# Patient Record
Sex: Female | Born: 1990 | Hispanic: Yes | Marital: Married | Smoking: Never smoker
Health system: Southern US, Community
[De-identification: ages and names within clinical notes are randomized; demographics above are authoritative.]

## PROBLEM LIST (undated history)

## (undated) ENCOUNTER — Inpatient Hospital Stay (HOSPITAL_COMMUNITY): Payer: Self-pay

## (undated) DIAGNOSIS — Z789 Other specified health status: Secondary | ICD-10-CM

## (undated) HISTORY — DX: Other specified health status: Z78.9

## (undated) HISTORY — PX: RHINOPLASTY: SUR1284

---

## 2014-03-19 ENCOUNTER — Emergency Department: Payer: Self-pay | Admitting: Emergency Medicine

## 2014-05-14 LAB — OB RESULTS CONSOLE HEPATITIS B SURFACE ANTIGEN: Hepatitis B Surface Ag: NEGATIVE

## 2014-05-14 LAB — OB RESULTS CONSOLE ABO/RH: RH Type: POSITIVE

## 2014-05-14 LAB — OB RESULTS CONSOLE ANTIBODY SCREEN: Antibody Screen: NEGATIVE

## 2014-05-14 LAB — OB RESULTS CONSOLE RUBELLA ANTIBODY, IGM: RUBELLA: IMMUNE

## 2014-05-14 LAB — OB RESULTS CONSOLE GC/CHLAMYDIA
Chlamydia: NEGATIVE
Gonorrhea: NEGATIVE

## 2014-05-14 LAB — OB RESULTS CONSOLE HIV ANTIBODY (ROUTINE TESTING): HIV: NONREACTIVE

## 2014-05-14 LAB — OB RESULTS CONSOLE RPR: RPR: NONREACTIVE

## 2014-08-03 NOTE — L&D Delivery Note (Signed)
Final Labor Progress Note At 3007 pt reports an increased in rectal pressure.  VE C/C/+2.  FHR remained reassuring with reoccuring variables decelerations, over all reassured. Dr Alesia Richards called to the bedside to evaluate decels  Vaginal Delivery Note The pt utilized an epidural as pain management.   Spontaneous rupture of membranes today, at 0213, clear.  GBS was negative.  Cervical dilation was complete at 1420.  NICHD Category 2.    Pushing with guidance began at  1436.   After 13 minutes of pushing the head, shoulders and the body of a viable female infant "Antonio" delivered spontaneously with maternal effort in the ROA position at 1449  With vigorous tone and spontaneous cry, the infant was placed on moms abd.  After the umbilical cord was clamped it was cut by the FOB, then cord blood was obtained for evaluation. Spontaneous delivery of a intact placenta with a 3 vessel cord via Shultz at  1512.   Episiotomy: None   The vulva, perineum, vaginal vault, rectum and cervix were inspected and revealed a 1 degree vaginal which was repair using a 3-0 vicryl on a CT needle and a superficial bilateral inner labial - the right was repaired using a 4-0 vicryl on a SH and the left was hemostatic, not repaired )  Lidocaine was not used, the epidural was sufficient for the repair.   Postpartum pitocin as ordered.  Fundus firm, lochia minimum, bleeding under control. EBL TBD, Pt hemodynamically stable.   Sponge, laps and needle count correct and verified with the primary care nurse.  Attending MD available at all times.    Routine postpartum orders   Mother unsure about method of contraception Infant to have patient out circumcision   Placenta to pathology: NO     Cord Gases sent to lab: YES Cord blood sent to lab: YES   APGARS:  8 at 1 minute and 9 at 5 minutes Weight:. TBD     Both mom and baby were left in stable condition      Zachary Nole, CNM, MSN 12/20/2014. 4:01 PM

## 2014-10-10 ENCOUNTER — Inpatient Hospital Stay (HOSPITAL_COMMUNITY)
Admission: AD | Admit: 2014-10-10 | Discharge: 2014-10-10 | Disposition: A | Discharge status: Home / Self Care | Payer: Medicaid Other | Source: Ambulatory Visit | Attending: Obstetrics & Gynecology | Admitting: Obstetrics & Gynecology

## 2014-10-10 ENCOUNTER — Other Ambulatory Visit: Payer: Self-pay | Admitting: Obstetrics and Gynecology

## 2014-10-10 ENCOUNTER — Encounter (HOSPITAL_COMMUNITY): Payer: Self-pay | Admitting: *Deleted

## 2014-10-10 DIAGNOSIS — Z3A30 30 weeks gestation of pregnancy: Secondary | ICD-10-CM | POA: Diagnosis not present

## 2014-10-10 MED ORDER — BETAMETHASONE SOD PHOS & ACET 6 (3-3) MG/ML IJ SUSP
12.0000 mg | Freq: Every day | INTRAMUSCULAR | Status: DC
  Administered 2014-10-10: 12 mg via INTRAMUSCULAR
  Filled 2014-10-10: qty 2

## 2014-10-11 ENCOUNTER — Inpatient Hospital Stay (HOSPITAL_COMMUNITY)
Admission: AD | Admit: 2014-10-11 | Discharge: 2014-10-11 | Disposition: A | Discharge status: Home / Self Care | Payer: Medicaid Other | Source: Ambulatory Visit | Attending: Obstetrics and Gynecology | Admitting: Obstetrics and Gynecology

## 2014-10-11 DIAGNOSIS — Z3A30 30 weeks gestation of pregnancy: Secondary | ICD-10-CM | POA: Insufficient documentation

## 2014-10-11 MED ORDER — BETAMETHASONE SOD PHOS & ACET 6 (3-3) MG/ML IJ SUSP
12.0000 mg | Freq: Once | INTRAMUSCULAR | Status: AC
  Administered 2014-10-11: 12 mg via INTRAMUSCULAR
  Filled 2014-10-11: qty 2

## 2014-10-11 NOTE — MAU Note (Signed)
Pt here for 2nd betamethasone injection.

## 2014-11-29 LAB — OB RESULTS CONSOLE GBS: GBS: NEGATIVE

## 2014-12-03 ENCOUNTER — Institutional Professional Consult (permissible substitution): Payer: Self-pay | Admitting: Pediatrics

## 2014-12-20 ENCOUNTER — Inpatient Hospital Stay (HOSPITAL_COMMUNITY)
Admission: AD | Admit: 2014-12-20 | Discharge: 2014-12-22 | DRG: 775 | Disposition: A | Discharge status: Home / Self Care | Payer: Medicaid Other | Source: Ambulatory Visit | Attending: Obstetrics & Gynecology | Admitting: Obstetrics & Gynecology

## 2014-12-20 ENCOUNTER — Inpatient Hospital Stay (HOSPITAL_COMMUNITY): Payer: Medicaid Other | Admitting: Anesthesiology

## 2014-12-20 ENCOUNTER — Encounter (HOSPITAL_COMMUNITY): Payer: Self-pay | Admitting: *Deleted

## 2014-12-20 DIAGNOSIS — Z3403 Encounter for supervision of normal first pregnancy, third trimester: Secondary | ICD-10-CM | POA: Diagnosis present

## 2014-12-20 DIAGNOSIS — Z3A39 39 weeks gestation of pregnancy: Secondary | ICD-10-CM | POA: Diagnosis present

## 2014-12-20 DIAGNOSIS — IMO0001 Reserved for inherently not codable concepts without codable children: Secondary | ICD-10-CM

## 2014-12-20 LAB — CBC
HEMATOCRIT: 35.3 % — AB (ref 36.0–46.0)
Hemoglobin: 11.7 g/dL — ABNORMAL LOW (ref 12.0–15.0)
MCH: 25.9 pg — AB (ref 26.0–34.0)
MCHC: 33.1 g/dL (ref 30.0–36.0)
MCV: 78.1 fL (ref 78.0–100.0)
Platelets: 248 10*3/uL (ref 150–400)
RBC: 4.52 MIL/uL (ref 3.87–5.11)
RDW: 14.8 % (ref 11.5–15.5)
WBC: 16.4 10*3/uL — ABNORMAL HIGH (ref 4.0–10.5)

## 2014-12-20 LAB — TYPE AND SCREEN
ABO/RH(D): A POS
ANTIBODY SCREEN: NEGATIVE

## 2014-12-20 LAB — HIV ANTIBODY (ROUTINE TESTING W REFLEX): HIV Screen 4th Generation wRfx: NONREACTIVE

## 2014-12-20 LAB — RPR: RPR: NONREACTIVE

## 2014-12-20 LAB — ABO/RH: ABO/RH(D): A POS

## 2014-12-20 MED ORDER — SIMETHICONE 80 MG PO CHEW: 80.0000 mg | CHEWABLE_TABLET | ORAL | Status: DC | PRN

## 2014-12-20 MED ORDER — OXYCODONE-ACETAMINOPHEN 5-325 MG PO TABS: 2.0000 | ORAL_TABLET | ORAL | Status: DC | PRN

## 2014-12-20 MED ORDER — ZOLPIDEM TARTRATE 5 MG PO TABS: 5.0000 mg | ORAL_TABLET | Freq: Every evening | ORAL | Status: DC | PRN

## 2014-12-20 MED ORDER — LACTATED RINGERS IV SOLN: INTRAVENOUS | Status: DC

## 2014-12-20 MED ORDER — DIBUCAINE 1 % RE OINT: 1.0000 "application " | TOPICAL_OINTMENT | RECTAL | Status: DC | PRN

## 2014-12-20 MED ORDER — EPHEDRINE 5 MG/ML INJ
10.0000 mg | INTRAVENOUS | Status: DC | PRN
  Filled 2014-12-20: qty 2

## 2014-12-20 MED ORDER — FENTANYL CITRATE (PF) 100 MCG/2ML IJ SOLN
50.0000 ug | INTRAMUSCULAR | Status: DC | PRN
  Administered 2014-12-20: 50 ug via INTRAVENOUS
  Filled 2014-12-20: qty 2

## 2014-12-20 MED ORDER — ONDANSETRON HCL 4 MG/2ML IJ SOLN: 4.0000 mg | INTRAMUSCULAR | Status: DC | PRN

## 2014-12-20 MED ORDER — ACETAMINOPHEN 325 MG PO TABS: 650.0000 mg | ORAL_TABLET | ORAL | Status: DC | PRN

## 2014-12-20 MED ORDER — OXYTOCIN 40 UNITS IN LACTATED RINGERS INFUSION - SIMPLE MED
62.5000 mL/h | INTRAVENOUS | Status: DC
  Administered 2014-12-20: 62.5 mL/h via INTRAVENOUS
  Filled 2014-12-20: qty 1000

## 2014-12-20 MED ORDER — IBUPROFEN 600 MG PO TABS
600.0000 mg | ORAL_TABLET | Freq: Four times a day (QID) | ORAL | Status: DC
  Administered 2014-12-20 – 2014-12-22 (×6): 600 mg via ORAL
  Filled 2014-12-20 (×6): qty 1

## 2014-12-20 MED ORDER — OXYTOCIN 40 UNITS IN LACTATED RINGERS INFUSION - SIMPLE MED
1.0000 m[IU]/min | INTRAVENOUS | Status: DC
  Administered 2014-12-20: 2 m[IU]/min via INTRAVENOUS

## 2014-12-20 MED ORDER — ONDANSETRON HCL 4 MG/2ML IJ SOLN: 4.0000 mg | Freq: Four times a day (QID) | INTRAMUSCULAR | Status: DC | PRN

## 2014-12-20 MED ORDER — WITCH HAZEL-GLYCERIN EX PADS: 1.0000 "application " | MEDICATED_PAD | CUTANEOUS | Status: DC | PRN

## 2014-12-20 MED ORDER — TETANUS-DIPHTH-ACELL PERTUSSIS 5-2.5-18.5 LF-MCG/0.5 IM SUSP: 0.5000 mL | Freq: Once | INTRAMUSCULAR | Status: DC

## 2014-12-20 MED ORDER — DIPHENHYDRAMINE HCL 50 MG/ML IJ SOLN: 12.5000 mg | INTRAMUSCULAR | Status: DC | PRN

## 2014-12-20 MED ORDER — FENTANYL 2.5 MCG/ML BUPIVACAINE 1/10 % EPIDURAL INFUSION (WH - ANES)
14.0000 mL/h | INTRAMUSCULAR | Status: DC | PRN
  Administered 2014-12-20 (×2): 14 mL/h via EPIDURAL
  Filled 2014-12-20 (×2): qty 125

## 2014-12-20 MED ORDER — CITRIC ACID-SODIUM CITRATE 334-500 MG/5ML PO SOLN: 30.0000 mL | ORAL | Status: DC | PRN

## 2014-12-20 MED ORDER — LACTATED RINGERS IV SOLN
500.0000 mL | INTRAVENOUS | Status: DC | PRN
  Administered 2014-12-20 (×2): 500 mL via INTRAVENOUS

## 2014-12-20 MED ORDER — OXYCODONE-ACETAMINOPHEN 5-325 MG PO TABS: 1.0000 | ORAL_TABLET | ORAL | Status: DC | PRN

## 2014-12-20 MED ORDER — BENZOCAINE-MENTHOL 20-0.5 % EX AERO
1.0000 "application " | INHALATION_SPRAY | CUTANEOUS | Status: DC | PRN
  Filled 2014-12-20: qty 56

## 2014-12-20 MED ORDER — OXYCODONE-ACETAMINOPHEN 5-325 MG PO TABS
1.0000 | ORAL_TABLET | ORAL | Status: DC | PRN
Start: 2014-12-20 — End: 2014-12-20

## 2014-12-20 MED ORDER — PHENYLEPHRINE 40 MCG/ML (10ML) SYRINGE FOR IV PUSH (FOR BLOOD PRESSURE SUPPORT)
80.0000 ug | PREFILLED_SYRINGE | INTRAVENOUS | Status: DC | PRN
  Filled 2014-12-20: qty 20
  Filled 2014-12-20: qty 2

## 2014-12-20 MED ORDER — LIDOCAINE HCL (PF) 1 % IJ SOLN
INTRAMUSCULAR | Status: DC | PRN
  Administered 2014-12-20 (×2): 5 mL
  Administered 2014-12-20: 3 mL

## 2014-12-20 MED ORDER — SENNOSIDES-DOCUSATE SODIUM 8.6-50 MG PO TABS
2.0000 | ORAL_TABLET | ORAL | Status: DC
  Administered 2014-12-21 – 2014-12-22 (×2): 2 via ORAL
  Filled 2014-12-20 (×2): qty 2

## 2014-12-20 MED ORDER — PRENATAL MULTIVITAMIN CH
1.0000 | ORAL_TABLET | Freq: Every day | ORAL | Status: DC
  Administered 2014-12-21: 1 via ORAL
  Filled 2014-12-20: qty 1

## 2014-12-20 MED ORDER — OXYTOCIN BOLUS FROM INFUSION: 500.0000 mL | INTRAVENOUS | Status: DC

## 2014-12-20 MED ORDER — TERBUTALINE SULFATE 1 MG/ML IJ SOLN
0.2500 mg | Freq: Once | INTRAMUSCULAR | Status: DC | PRN
Start: 2014-12-20 — End: 2014-12-20
  Filled 2014-12-20: qty 1

## 2014-12-20 MED ORDER — LIDOCAINE HCL (PF) 1 % IJ SOLN
30.0000 mL | INTRAMUSCULAR | Status: DC | PRN
  Filled 2014-12-20: qty 30

## 2014-12-20 MED ORDER — ONDANSETRON HCL 4 MG PO TABS: 4.0000 mg | ORAL_TABLET | ORAL | Status: DC | PRN

## 2014-12-20 MED ORDER — DIPHENHYDRAMINE HCL 25 MG PO CAPS: 25.0000 mg | ORAL_CAPSULE | Freq: Four times a day (QID) | ORAL | Status: DC | PRN

## 2014-12-20 MED ORDER — LANOLIN HYDROUS EX OINT: TOPICAL_OINTMENT | CUTANEOUS | Status: DC | PRN

## 2014-12-20 NOTE — Progress Notes (Signed)
Delivery of live viable female by [employer], CNM APGARS 8,9

## 2014-12-20 NOTE — Plan of Care (Signed)
Problem: Consults Goal: Birthing Suites Patient Information Press F2 to bring up selections list  Outcome: Completed/Met Date Met:  12/20/14  Pt 37-[redacted] weeks EGA and Other (specify with a note) SROM

## 2014-12-20 NOTE — Anesthesia Preprocedure Evaluation (Signed)
Anesthesia Evaluation  Patient identified by MRN, date of birth, ID band Patient awake    Reviewed: Allergy & Precautions, H&P , NPO status , Patient's Chart, lab work & pertinent test results  History of Anesthesia Complications Negative for: history of anesthetic complications  Airway Mallampati: II  TM Distance: >3 FB Neck ROM: full    Dental no notable dental hx. (+) Teeth Intact   Pulmonary neg pulmonary ROS,  breath sounds clear to auscultation  Pulmonary exam normal       Cardiovascular negative cardio ROS Normal cardiovascular examRhythm:regular Rate:Normal     Neuro/Psych negative neurological ROS  negative psych ROS   GI/Hepatic negative GI ROS, Neg liver ROS,   Endo/Other  negative endocrine ROS  Renal/GU negative Renal ROS  negative genitourinary   Musculoskeletal   Abdominal   Peds  Hematology negative hematology ROS (+)   Anesthesia Other Findings   Reproductive/Obstetrics (+) Pregnancy                             Anesthesia Physical Anesthesia Plan  ASA: II  Anesthesia Plan: Epidural   Post-op Pain Management:    Induction:   Airway Management Planned:   Additional Equipment:   Intra-op Plan:   Post-operative Plan:   Informed Consent: I have reviewed the patients History and Physical, chart, labs and discussed the procedure including the risks, benefits and alternatives for the proposed anesthesia with the patient or authorized representative who has indicated his/her understanding and acceptance.     Plan Discussed with:   Anesthesia Plan Comments:         Anesthesia Quick Evaluation

## 2014-12-20 NOTE — MAU Note (Signed)
Pt reports ROM at 0330, contractions

## 2014-12-20 NOTE — Progress Notes (Signed)
V. Standard requested to come evalute FHR tracing, with consistent variables, minimal variability and increased baseline, and interventions provided. Provider on way to assess

## 2014-12-20 NOTE — Progress Notes (Addendum)
Labor Progress  Subjective: Comfortable with epidural  Objective: BP 123/56 mmHg  Pulse 101  Temp(Src) 98.8 F (37.1 C) (Oral)  Resp 18  Ht 5\' 2"  (1.575 m)  Wt 162 lb (73.483 kg)  BMI 29.62 kg/m2  SpO2 99%   Total I/O In: -  Out: 1000 [Urine:1000] FHT:130 moderate variability, + accel, occasional variable decel,  CTX:  regular, every 6-8 minutes Uterus gravid, soft non tender SVE:  Dilation: 8 Effacement (%): 80 Station: +1 Exam by::  (V. Kaison Mcparland, CNM)   Assessment:  IUP at 39.6 weeks NICHD: Category 2 Membranes:  intact Labor progress: adquate labor KDT:[id number]   Plan: Continue labor plan Continuous monitoring Rest Frequent position changes to facilitate fetal rotation and descent. Will reassess with cervical exam at 1300 or earlier if necessary Start pitocin per protocol      Elfida Shimada, CNM, MSN 12/20/2014. 11:26 AM    Addendum 1110 VE unchange FHT 145, + accel, moderate variability, occasional variable decel Start piticon per protocol

## 2014-12-20 NOTE — H&P (Signed)
  Tiffany Chavez is a 24 y.o. female, G1P0 at 39.6 weeks, presenting for contractions.  Patient reports contractions have been irregular for 2 days, but become regular this am around 1230.  Patient further reports SROM while en route to the hospital.    There are no active problems to display for this patient.   History of present pregnancy: Patient entered care at 9.4 weeks.   EDC of 12/21/2014 was established by 10.5wk [place] on 05/30/2014.   Anatomy scan:  18.5 weeks, with limited findings and right pyelectasis noted.  Anterior placenta.   Additional [place] evaluations:   -20.6wks Follow up anatomy. EFW 15 oz = 69%tile. Transverse pres. Head maternal R. Nelda Marseille. Unilateral pylectasis LK=.51/RK= WNL. Previous u/s RK=.51/ LK WNL.   -28.6wks [place]: SIUP, VERTEX, NORMAL FLUID, NORMAL ANATOMY TODAY, CX 3.99 CM, GROWTH 69% Significant prenatal events:  Patient with common pregnancy discomforts to include n/v, headaches, pelvic pressure, cramping, vaginal discharge, and carpal tunnel.  Patient treated for yeast, BV, and cervicitis.   Last evaluation:  12/19/2014   39 wks 5 days  By L.Eulas Post, FNP    Vertex, FHR 130, SVE: 1cm / 50% / -2, B/P 112/70, Wt 161 lbs  OB History    Gravida Para Term Preterm AB TAB SAB Ectopic Multiple Living   1              History reviewed. No pertinent past medical history. History reviewed. No pertinent past surgical history. Family History: family history is not on file. Social History:  reports that she has never smoked. She has never used smokeless tobacco. Her alcohol and drug histories are not on file.   Prenatal Transfer Tool  Maternal Diabetes: No Genetic Screening: Declined Maternal Ultrasounds/Referrals: Normal Fetal Ultrasounds or other Referrals:  None Maternal Substance Abuse:  No Significant Maternal Medications:  None Significant Maternal Lab Results: Lab values include: Group B Strep negative    ROS:  +Ctx, +LOF, +FM, -VB  No Known  Allergies   Dilation: 1 Effacement (%): 100 Station: -1 Exam by:: Shoshanna Mcquitty,CNM Blood pressure 133/76, pulse 93, temperature 98.7 F (37.1 C), temperature source Oral, resp. rate 20, height 5\' 2"  (1.575 m), weight 73.483 kg (162 lb).  Physical Exam  Constitutional: She is oriented to person, place, and time. She appears well-developed and well-nourished.  HENT:  Head: Normocephalic and atraumatic.  Eyes: EOM are normal.  Neck: Normal range of motion.  Cardiovascular: Normal rate, regular rhythm and normal heart sounds.   Respiratory: Effort normal.  GI: Soft.  Musculoskeletal: Normal range of motion.  Neurological: She is alert and oriented to person, place, and time.  Skin: Skin is warm and dry.     FHR: 120 bpm, Mod Var, -Decels, +Accels UCs:  Q2-23min, palpates moderate  Prenatal labs: ABO, Rh: A/Positive/-- (10/12 0000) Antibody: Negative (10/12 0000) Rubella:   Immune RPR: Nonreactive (10/12 0000)  HBsAg: Negative (10/12 0000)  HIV: Non-reactive (10/12 0000)  GBS: Negative (04/28 0000) Sickle cell/Hgb electrophoresis:  N/A Pap:  Normal 06/2014 GC:  Negative Chlamydia:  Negative Other:  +fFN 10/10/2014    Assessment IUP at 39.6wks Cat I FT GBS Negative SROM Early Labor  Plan: Admit to BS Routine Labor and Delivery Orders per [facility] Protocol Okay for epidural Will update Dr. Chauncey Cruel Rivard on patient status    Gavin Pound [facility], MSN 12/20/2014, 4:13 AM

## 2014-12-20 NOTE — Anesthesia Postprocedure Evaluation (Signed)
Anesthesia Post Note  Patient: Tiffany Chavez  Procedure(s) Performed: * No procedures listed *  Anesthesia type: Epidural  Patient location: Mother/Baby  Post pain: Pain level controlled  Post assessment: Post-op Vital signs reviewed  Last Vitals:  Filed Vitals:   12/20/14 1701  BP: 114/65  Pulse: 92  Temp: 36.9 C  Resp: 20    Post vital signs: Reviewed  Level of consciousness:alert  Complications: No apparent anesthesia complications

## 2014-12-20 NOTE — Anesthesia Procedure Notes (Signed)
Epidural Patient location during procedure: OB  Staffing Anesthesiologist: Montez Hageman Performed by: anesthesiologist   Preanesthetic Checklist Completed: patient identified, site marked, surgical consent, pre-op evaluation, timeout performed, IV checked, risks and benefits discussed and monitors and equipment checked  Epidural Patient position: sitting Prep: DuraPrep Patient monitoring: heart rate, continuous pulse ox and blood pressure Approach: midline Location: L2-L3 Injection technique: LOR saline  Needle:  Needle type: Tuohy  Needle gauge: 17 G Needle length: 9 cm and 9 Needle insertion depth: 5 cm Catheter type: closed end flexible Catheter size: 20 Guage Catheter at skin depth: 10 cm Test dose: negative  Assessment Events: blood not aspirated, injection not painful, no injection resistance, negative IV test and no paresthesia  Additional Notes   Patient tolerated the insertion well without complications.

## 2014-12-20 NOTE — Progress Notes (Signed)
Merlyn Albert, CNM at bedside discussing poc for patient. Notified of FHR tracing with consistent variables, increased baseline, and minimal variability. Interventions provided to patient to include O2, position changes, IV bolus, and IFSE placement.

## 2014-12-21 LAB — CBC
HCT: 27.2 % — ABNORMAL LOW (ref 36.0–46.0)
Hemoglobin: 8.7 g/dL — ABNORMAL LOW (ref 12.0–15.0)
MCH: 25.4 pg — ABNORMAL LOW (ref 26.0–34.0)
MCHC: 32 g/dL (ref 30.0–36.0)
MCV: 79.5 fL (ref 78.0–100.0)
Platelets: 185 10*3/uL (ref 150–400)
RBC: 3.42 MIL/uL — AB (ref 3.87–5.11)
RDW: 15.1 % (ref 11.5–15.5)
WBC: 11.8 10*3/uL — ABNORMAL HIGH (ref 4.0–10.5)

## 2014-12-21 MED ORDER — TETANUS-DIPHTH-ACELL PERTUSSIS 5-2.5-18.5 LF-MCG/0.5 IM SUSP
0.5000 mL | Freq: Once | INTRAMUSCULAR | Status: AC
  Administered 2014-12-22: 0.5 mL via INTRAMUSCULAR

## 2014-12-21 NOTE — Lactation Note (Signed)
This note was copied from the chart of [place]. Lactation Consultation Note Initial visit at 28 hours of age.  Mom reports several good feedings with minimal pain.  Baby just finished a 22 minute feeding.  Noted mom is latching baby in cross cradle hold.  Discussed benefits of other positions.  [facility] LC resources given and discussed.  Encouraged to feed with early cues on demand.  Early newborn behavior discussed.  Hand expression reported by mom with colostrum visible.  Mom to call for assist as needed.    Patient Name: Tiffany Chavez Today's Date: 12/21/2014     Maternal Data Has patient been taught Hand Expression?: Yes Does the patient have breastfeeding experience prior to this delivery?: No  Feeding Feeding Type: Breast Fed Length of feed: 5 min  LATCH Score/Interventions Latch: Repeated attempts needed to sustain latch, nipple held in mouth throughout feeding, stimulation needed to elicit sucking reflex. Intervention(s): Adjust position;Assist with latch;Breast massage;Breast compression  Audible Swallowing: None Intervention(s): Hand expression;Skin to skin Intervention(s): Skin to skin;Hand expression  Type of Nipple: Everted at rest and after stimulation  Comfort (Breast/Nipple): Soft / non-tender     Hold (Positioning): Assistance needed to correctly position infant at breast and maintain latch. Intervention(s): Breastfeeding basics reviewed  LATCH Score: 6  Lactation Tools Discussed/Used     Consult Status Consult Status: Follow-up Date: 12/22/14 Follow-up type: In-patient    Justice Britain 12/21/2014, 5:09 PM

## 2014-12-21 NOTE — Discharge Summary (Signed)
Vaginal Delivery Discharge Summary  ALL information will be verified prior to discharge  Tiffany Chavez  DOB:    Aug 11, 1990 MRN:    [id number] CSN:    [id number]  Date of admission:                  12/20/14  Date of discharge:                   12/22/14  Procedures this admission: SVD  Date of Delivery: 12/20/14  Newborn Data:  Live born  Information for the patient's newborn:  Para Skeans Boy Gulianna [[id number]]  female   Live born female  Birth Weight: 7 lb 3 oz (3260 g) APGAR: 8, 9  Home with mother. Name: Antonio Circumcision Plan: out patient   History of Present Illness: Tiffany Chavez is a 24 y.o. female, G1P1001, who presents at [redacted]w[redacted]d weeks gestation. The patient has been followed at the [facility] division of [facility]. She was admitted onset of labor. Her pregnancy has been complicated by:  Patient Active Problem List   Diagnosis Date Noted  . Active labor 12/20/2014  . Normal vaginal delivery 12/20/2014    Hospital course: The patient was admitted for labor.   Her labor was not complicated. She proceeded to have a vaginal delivery of a healthy infant. Her delivery was not complicated. Her postpartum course was not complicated. She was discharged to home on postpartum day 2 doing well.  Feeding: breast  Contraception: unsure     Discharge hemoglobin: HEMOGLOBIN  Date Value Ref Range Status  12/21/2014 8.7* 12.0 - 15.0 g/dL Final    Comment:    REPEATED TO VERIFY DELTA CHECK NOTED    HCT  Date Value Ref Range Status  12/21/2014 27.2* 36.0 - 46.0 % Final    PreNatal Labs ABO, Rh: --/--/A POS, A POS (05/19 0420)  Rhogam given on: sure Antibody: NEG (05/19 0420) Rubella:    immune RPR: Non Reactive (05/19 0420)  HBsAg: Negative (10/12 0000)  HIV: Non-reactive (10/12 0000)  GBS: Negative (04/28 0000)  Discharge Physical Exam:  General: alert and  cooperative Lochia: appropriate Uterine Fundus: firm Incision: healing well DVT Evaluation: No evidence of DVT seen on physical exam.  Intrapartum Procedures: spontaneous vaginal delivery Postpartum Procedures: none Complications-Operative and Postpartum: 1 degree vaginal and superficial inner labial  Discharge Diagnoses: Term Pregnancy-delivered,  asymptomatic anemia  Activity:           pelvic rest Diet:                routine Medications: PNV, Ibuprofen, Iron and Percocet Condition:      stable     Postpartum Teaching: Nutrition, exercise, return to work or school, family visits, sexual activity, home rest, vaginal bleeding, pelvic rest, family planning, s/s of PPD, breast care peri-care and incision care   Discharge to: home     Rayshawn Maney, CNM, MSN 12/21/2014. 11:33 AM  All information will be verified prior to discharge   Postpartum Care After Vaginal Delivery  After you deliver your newborn (postpartum period), the usual stay in the hospital is 24 72 hours. If there were problems with your labor or delivery, or if you have other medical problems, you might be in the hospital longer.  While you are in the hospital, you will receive help and instructions on how to care for yourself and your newborn during the postpartum period.  While you are in  the hospital:  Be sure to tell your nurses if you have pain or discomfort, as well as where you feel the pain and what makes the pain worse.  If you had an incision made near your vagina (episiotomy) or if you had some tearing during delivery, the nurses may put ice packs on your episiotomy or tear. The ice packs may help to reduce the pain and swelling.  If you are breastfeeding, you may feel uncomfortable contractions of your uterus for a couple of weeks. This is normal. The contractions help your uterus get back to normal size.  It is normal to have some bleeding after delivery.  For the first 1 3 days after delivery,  the flow is red and the amount may be similar to a period.  It is common for the flow to start and stop.  In the first few days, you may pass some small clots. Let your nurses know if you begin to pass large clots or your flow increases.  Do not  flush blood clots down the toilet before having the nurse look at them.  During the next 3 10 days after delivery, your flow should become more watery and pink or brown-tinged in color.  Ten to fourteen days after delivery, your flow should be a small amount of yellowish-white discharge.  The amount of your flow will decrease over the first few weeks after delivery. Your flow may stop in 6 8 weeks. Most women have had their flow stop by 12 weeks after delivery.  You should change your sanitary pads frequently.  Wash your hands thoroughly with soap and water for at least 20 seconds after changing pads, using the toilet, or before holding or feeding your newborn.  You should feel like you need to empty your bladder within the first 6 8 hours after delivery.  In case you become weak, lightheaded, or faint, call your nurse before you get out of bed for the first time and before you take a shower for the first time.  Within the first few days after delivery, your breasts may begin to feel tender and full. This is called engorgement. Breast tenderness usually goes away within 48 72 hours after engorgement occurs. You may also notice milk leaking from your breasts. If you are not breastfeeding, do not stimulate your breasts. Breast stimulation can make your breasts produce more milk.  Spending as much time as possible with your newborn is very important. During this time, you and your newborn can feel close and get to know each other. Having your newborn stay in your room (rooming in) will help to strengthen the bond with your newborn. It will give you time to get to know your newborn and become comfortable caring for your newborn.  Your hormones change  after delivery. Sometimes the hormone changes can temporarily cause you to feel sad or tearful. These feelings should not last more than a few days. If these feelings last longer than that, you should talk to your caregiver.  If desired, talk to your caregiver about methods of family planning or contraception.  Talk to your caregiver about immunizations. Your caregiver may want you to have the following immunizations before leaving the hospital:  Tetanus, diphtheria, and pertussis (Tdap) or tetanus and diphtheria (Td) immunization. It is very important that you and your family (including grandparents) or others caring for your newborn are up-to-date with the Tdap or Td immunizations. The Tdap or Td immunization can help protect your newborn from  getting ill.  Rubella immunization.  Varicella (chickenpox) immunization.  Influenza immunization. You should receive this annual immunization if you did not receive the immunization during your pregnancy. Document Released: 05/17/2007 Document Revised: 04/13/2012 Document Reviewed: 03/16/2012 [facility] Patient Information 2014 [place].   Postpartum Depression and Baby Blues  The postpartum period begins right after the birth of a baby. During this time, there is often a great amount of joy and excitement. It is also a time of considerable changes in the life of the parent(s). Regardless of how many times a mother gives birth, each child brings new challenges and dynamics to the family. It is not unusual to have feelings of excitement accompanied by confusing shifts in moods, emotions, and thoughts. All mothers are at risk of developing postpartum depression or the "baby blues." These mood changes can occur right after giving birth, or they may occur many months after giving birth. The baby blues or postpartum depression can be mild or severe. Additionally, postpartum depression can resolve rather quickly, or it can be a long-term  condition. CAUSES Elevated hormones and their rapid decline are thought to be a main cause of postpartum depression and the baby blues. There are a number of hormones that radically change during and after pregnancy. Estrogen and progesterone usually decrease immediately after delivering your baby. The level of thyroid hormone and various cortisol steroids also rapidly drop. Other factors that play a major role in these changes include major life events and genetics.  RISK FACTORS If you have any of the following risks for the baby blues or postpartum depression, know what symptoms to watch out for during the postpartum period. Risk factors that may increase the likelihood of getting the baby blues or postpartum depression include: 1. Havinga personal or family history of depression. 2. Having depression while being pregnant. 3. Having premenstrual or oral contraceptive-associated mood issues. 4. Having exceptional life stress. 5. Having marital conflict. 6. Lacking a social support network. 7. Having a baby with special needs. 8. Having health problems such as diabetes. SYMPTOMS Baby blues symptoms include:  Brief fluctuations in mood, such as going from extreme happiness to sadness.  Decreased concentration.  Difficulty sleeping.  Crying spells, tearfulness.  Irritability.  Anxiety. Postpartum depression symptoms typically begin within the first month after giving birth. These symptoms include:  Difficulty sleeping or excessive sleepiness.  Marked weight loss.  Agitation.  Feelings of worthlessness.  Lack of interest in activity or food. Postpartum psychosis is a very concerning condition and can be dangerous. Fortunately, it is rare. Displaying any of the following symptoms is cause for immediate medical attention. Postpartum psychosis symptoms include:  Hallucinations and delusions.  Bizarre or disorganized behavior.  Confusion or disorientation. DIAGNOSIS  A  diagnosis is made by an evaluation of your symptoms. There are no medical or lab tests that lead to a diagnosis, but there are various questionnaires that a caregiver may use to identify those with the baby blues, postpartum depression, or psychosis. Often times, a screening tool called the [place] Postnatal Depression Scale is used to diagnose depression in the postpartum period.  TREATMENT The baby blues usually goes away on its own in 1 to 2 weeks. Social support is often all that is needed. You should be encouraged to get adequate sleep and rest. Occasionally, you may be given medicines to help you sleep.  Postpartum depression requires treatment as it can last several months or longer if it is not treated. Treatment may include individual or group  therapy, medicine, or both to address any social, physiological, and psychological factors that may play a role in the depression. Regular exercise, a healthy diet, rest, and social support may also be strongly recommended.  Postpartum psychosis is more serious and needs treatment right away. Hospitalization is often needed. HOME CARE INSTRUCTIONS  Get as much rest as you can. Nap when the baby sleeps.  Exercise regularly. Some women find yoga and walking to be beneficial.  Eat a balanced and nourishing diet.  Do little things that you enjoy. Have a cup of tea, take a bubble bath, read your favorite magazine, or listen to your favorite music.  Avoid alcohol.  Ask for help with household chores, cooking, grocery shopping, or running errands as needed. Do not try to do everything.  Talk to people close to you about how you are feeling. Get support from your partner, family members, friends, or other new moms.  Try to stay positive in how you think. Think about the things you are grateful for.  Do not spend a lot of time alone.  Only take medicine as directed by your caregiver.  Keep all your postpartum appointments.  Let your caregiver  know if you have any concerns. SEEK MEDICAL CARE IF: You are having a reaction or problems with your medicine. SEEK IMMEDIATE MEDICAL CARE IF:  You have suicidal feelings.  You feel you may harm the baby or someone else. Document Released: 04/23/2004 Document Revised: 10/12/2011 Document Reviewed: 05/26/2011 [facility] Patient Information 2014 [place], [place].     Breastfeeding Deciding to breastfeed is one of the best choices you can make for you and your baby. A change in hormones during pregnancy causes your breast tissue to grow and increases the number and size of your milk ducts. These hormones also allow proteins, sugars, and fats from your blood supply to make breast milk in your milk-producing glands. Hormones prevent breast milk from being released before your baby is born as well as prompt milk flow after birth. Once breastfeeding has begun, thoughts of your baby, as well as his or her sucking or crying, can stimulate the release of milk from your milk-producing glands.  BENEFITS OF BREASTFEEDING For Your Baby  Your first milk (colostrum) helps your baby's digestive system function better.   There are antibodies in your milk that help your baby fight off infections.   Your baby has a lower incidence of asthma, allergies, and sudden infant death syndrome.   The nutrients in breast milk are better for your baby than infant formulas and are designed uniquely for your baby's needs.   Breast milk improves your baby's brain development.   Your baby is less likely to develop other conditions, such as childhood obesity, asthma, or type 2 diabetes mellitus.  For You   Breastfeeding helps to create a very special bond between you and your baby.   Breastfeeding is convenient. Breast milk is always available at the correct temperature and costs nothing.   Breastfeeding helps to burn calories and helps you lose the weight gained during pregnancy.   Breastfeeding makes your  uterus contract to its prepregnancy size faster and slows bleeding (lochia) after you give birth.   Breastfeeding helps to lower your risk of developing type 2 diabetes mellitus, osteoporosis, and breast or ovarian cancer later in life. SIGNS THAT YOUR BABY IS HUNGRY Early Signs of Hunger  Increased alertness or activity.  Stretching.  Movement of the head from side to side.  Movement of the head  and opening of the mouth when the corner of the mouth or cheek is stroked (rooting).  Increased sucking sounds, smacking lips, cooing, sighing, or squeaking.  Hand-to-mouth movements.  Increased sucking of fingers or hands. Late Signs of Hunger  Fussing.  Intermittent crying. Extreme Signs of Hunger Signs of extreme hunger will require calming and consoling before your baby will be able to breastfeed successfully. Do not wait for the following signs of extreme hunger to occur before you initiate breastfeeding:   Restlessness.  A loud, strong cry.   Screaming.   BREASTFEEDING BASICS Breastfeeding Initiation  Find a comfortable place to sit or lie down, with your neck and back well supported.  Place a pillow or rolled up blanket under your baby to bring him or her to the level of your breast (if you are seated). Nursing pillows are specially designed to help support your arms and your baby while you breastfeed.  Make sure that your baby's abdomen is facing your abdomen.   Gently massage your breast. With your fingertips, massage from your chest wall toward your nipple in a circular motion. This encourages milk flow. You may need to continue this action during the feeding if your milk flows slowly.  Support your breast with 4 fingers underneath and your thumb above your nipple. Make sure your fingers are well away from your nipple and your baby's mouth.   Stroke your baby's lips gently with your finger or nipple.   When your baby's mouth is open wide enough, quickly bring  your baby to your breast, placing your entire nipple and as much of the colored area around your nipple (areola) as possible into your baby's mouth.   More areola should be visible above your baby's upper lip than below the lower lip.   Your baby's tongue should be between his or her lower gum and your breast.   Ensure that your baby's mouth is correctly positioned around your nipple (latched). Your baby's lips should create a seal on your breast and be turned out (everted).  It is common for your baby to suck about 2-3 minutes in order to start the flow of breast milk. Latching Teaching your baby how to latch on to your breast properly is very important. An improper latch can cause nipple pain and decreased milk supply for you and poor weight gain in your baby. Also, if your baby is not latched onto your nipple properly, he or she may swallow some air during feeding. This can make your baby fussy. Burping your baby when you switch breasts during the feeding can help to get rid of the air. However, teaching your baby to latch on properly is still the best way to prevent fussiness from swallowing air while breastfeeding. Signs that your baby has successfully latched on to your nipple:    Silent tugging or silent sucking, without causing you pain.   Swallowing heard between every 3-4 sucks.    Muscle movement above and in front of his or her ears while sucking.  Signs that your baby has not successfully latched on to nipple:   Sucking sounds or smacking sounds from your baby while breastfeeding.  Nipple pain. If you think your baby has not latched on correctly, slip your finger into the corner of your baby's mouth to break the suction and place it between your baby's gums. Attempt breastfeeding initiation again. Signs of Successful Breastfeeding Signs from your baby:   A gradual decrease in the number of sucks or  complete cessation of sucking.   Falling asleep.   Relaxation of  his or her body.   Retention of a small amount of milk in his or her mouth.   Letting go of your breast by himself or herself. Signs from you:  Breasts that have increased in firmness, weight, and size 1-3 hours after feeding.   Breasts that are softer immediately after breastfeeding.  Increased milk volume, as well as a change in milk consistency and color by the fifth day of breastfeeding.   Nipples that are not sore, cracked, or bleeding. Signs That Your [employer] is Getting Enough Milk  Wetting at least 3 diapers in a 24-hour period. The urine should be clear and pale yellow by age 340 days.  At least 3 stools in a 24-hour period by age 340 days. The stool should be soft and yellow.  At least 3 stools in a 24-hour period by age 56 days. The stool should be seedy and yellow.  No loss of weight greater than 10% of birth weight during the first 22 days of age.  Average weight gain of 4-7 ounces (113-198 g) per week after age 47 days.  Consistent daily weight gain by age 568 days, without weight loss after the age of 2 weeks. After a feeding, your baby may spit up a small amount. This is common. BREASTFEEDING FREQUENCY AND DURATION Frequent feeding will help you make more milk and can prevent sore nipples and breast engorgement. Breastfeed when you feel the need to reduce the fullness of your breasts or when your baby shows signs of hunger. This is called "breastfeeding on demand." Avoid introducing a pacifier to your baby while you are working to establish breastfeeding (the first 4-6 weeks after your baby is born). After this time you may choose to use a pacifier. Research has shown that pacifier use during the first year of a baby's life decreases the risk of sudden infant death syndrome (SIDS). Allow your baby to feed on each breast as long as he or she wants. Breastfeed until your baby is finished feeding. When your baby unlatches or falls asleep while feeding from the first breast, offer  the second breast. Because newborns are often sleepy in the first few weeks of life, you may need to awaken your baby to get him or her to feed. Breastfeeding times will vary from baby to baby. However, the following rules can serve as a guide to help you ensure that your baby is properly fed:  Newborns (babies 57 weeks of age or younger) may breastfeed every 1-3 hours.  Newborns should not go longer than 3 hours during the day or 5 hours during the night without breastfeeding.  You should breastfeed your baby a minimum of 8 times in a 24-hour period until you begin to introduce solid foods to your baby at around 11 months of age. BREAST MILK PUMPING Pumping and storing breast milk allows you to ensure that your baby is exclusively fed your breast milk, even at times when you are unable to breastfeed. This is especially important if you are going back to work while you are still breastfeeding or when you are not able to be present during feedings. Your lactation consultant can give you guidelines on how long it is safe to store breast milk.  A breast pump is a machine that allows you to pump milk from your breast into a sterile bottle. The pumped breast milk can then be stored in a refrigerator or  freezer. Some breast pumps are operated by hand, while others use electricity. Ask your lactation consultant which type will work best for you. Breast pumps can be purchased, but some hospitals and breastfeeding support groups lease breast pumps on a monthly basis. A lactation consultant can teach you how to hand express breast milk, if you prefer not to use a pump.  CARING FOR YOUR BREASTS WHILE YOU BREASTFEED Nipples can become dry, cracked, and sore while breastfeeding. The following recommendations can help keep your breasts moisturized and healthy:  Avoid using soap on your nipples.   Wear a supportive bra. Although not required, special nursing bras and tank tops are designed to allow access to your  breasts for breastfeeding without taking off your entire bra or top. Avoid wearing underwire-style bras or extremely tight bras.  Air dry your nipples for 3-60minutes after each feeding.   Use only cotton bra pads to absorb leaked breast milk. Leaking of breast milk between feedings is normal.   Use lanolin on your nipples after breastfeeding. Lanolin helps to maintain your skin's normal moisture barrier. If you use pure lanolin, you do not need to wash it off before feeding your baby again. Pure lanolin is not toxic to your baby. You may also hand express a few drops of breast milk and gently massage that milk into your nipples and allow the milk to air dry. In the first few weeks after giving birth, some women experience extremely full breasts (engorgement). Engorgement can make your breasts feel heavy, warm, and tender to the touch. Engorgement peaks within 3-5 days after you give birth. The following recommendations can help ease engorgement:  Completely empty your breasts while breastfeeding or pumping. You may want to start by applying warm, moist heat (in the shower or with warm water-soaked hand towels) just before feeding or pumping. This increases circulation and helps the milk flow. If your baby does not completely empty your breasts while breastfeeding, pump any extra milk after he or she is finished.  Wear a snug bra (nursing or regular) or tank top for 1-2 days to signal your body to slightly decrease milk production.  Apply ice packs to your breasts, unless this is too uncomfortable for you.  Make sure that your baby is latched on and positioned properly while breastfeeding. If engorgement persists after 48 hours of following these recommendations, contact your health care provider or a Science writer. OVERALL HEALTH CARE RECOMMENDATIONS WHILE BREASTFEEDING  Eat healthy foods. Alternate between meals and snacks, eating 3 of each per day. Because what you eat affects your  breast milk, some of the foods may make your baby more irritable than usual. Avoid eating these foods if you are sure that they are negatively affecting your baby.  Drink milk, fruit juice, and water to satisfy your thirst (about 10 glasses a day).   Rest often, relax, and continue to take your prenatal vitamins to prevent fatigue, stress, and anemia.  Continue breast self-awareness checks.  Avoid chewing and smoking tobacco.  Avoid alcohol and drug use. Some medicines that may be harmful to your baby can pass through breast milk. It is important to ask your health care provider before taking any medicine, including all over-the-counter and prescription medicine as well as vitamin and herbal supplements. It is possible to become pregnant while breastfeeding. If birth control is desired, ask your health care provider about options that will be safe for your baby. SEEK MEDICAL CARE IF:   You feel like you  want to stop breastfeeding or have become frustrated with breastfeeding.  You have painful breasts or nipples.  Your nipples are cracked or bleeding.  Your breasts are red, tender, or warm.  You have a swollen area on either breast.  You have a fever or chills.  You have nausea or vomiting.  You have drainage other than breast milk from your nipples.  Your breasts do not become full before feedings by the fifth day after you give birth.  You feel sad and depressed.  Your baby is too sleepy to eat well.  Your baby is having trouble sleeping.   Your baby is wetting less than 3 diapers in a 24-hour period.  Your baby has less than 3 stools in a 24-hour period.  Your baby's skin or the white part of his or her eyes becomes yellow.   Your baby is not gaining weight by 47 days of age. SEEK IMMEDIATE MEDICAL CARE IF:   Your baby is overly tired (lethargic) and does not want to wake up and feed.  Your baby develops an unexplained fever. Document Released: 07/20/2005  Document Revised: 07/25/2013 Document Reviewed: 01/11/2013 [facility] Patient Information 2015 [place], [place]. This information is not intended to replace advice given to you by your health care provider. Make sure you discuss any questions you have with your health care provider.

## 2014-12-21 NOTE — Progress Notes (Addendum)
Tiffany Chavez   Subjective: Post Partum Day 1 Vaginal delivery, 1 degree laceration vaginal, superficial inner labial Patient up ad lib, denies syncope, sob, chest pain or dizziness. Reports consuming regular diet without issues and denies N/V No issues with urination and reports bleeding is appropriate  Feeding:  breast Contraceptive plan:   unsure  Objective: Temp:  [97.9 F (36.6 C)-99.1 F (37.3 C)] 98 F (36.7 C) (05/20 0515) Pulse Rate:  [82-111] 88 (05/20 0515) Resp:  [16-20] 18 (05/20 0515) BP: (100-131)/(50-89) 103/63 mmHg (05/20 0515)  Physical Exam:  General: alert and cooperative Ext: WNL, no edema. No evidence of DVT seen on physical exam. Breast: Soft filling Lungs: CTAB Heart RRR without murmur  Abdomen:  Soft, fundus firm, lochia scant, + bowel sounds, non distended, non tender Lochia: appropriate Uterine Fundus: firm Laceration: healing well    Recent Labs  12/20/14 0420 12/21/14 0540  HGB 11.7* 8.7*  HCT 35.3* 27.2*    Assessment S/P Vaginal Delivery-Day 1 Stable  Normal Involution Breastfeeding Circumcision: out patient  Plan: Continue current care Plan for discharge tomorrow, Breastfeeding and Lactation consult Lactation support   Nyomie Ehrlich, CNM, MSN 12/21/2014, 11:31 AM

## 2014-12-21 NOTE — Progress Notes (Signed)
Patient thinks she received vaccine during early pregnancy, unsure what it was.  No vaccines noted in prenatal records.  Unclear whether she should receive Tdap now.

## 2014-12-22 ENCOUNTER — Ambulatory Visit: Payer: Self-pay

## 2014-12-22 MED ORDER — IBUPROFEN 600 MG PO TABS: 600.0000 mg | ORAL_TABLET | Freq: Four times a day (QID) | ORAL | Status: DC

## 2014-12-22 MED ORDER — OXYCODONE-ACETAMINOPHEN 5-325 MG PO TABS: 1.0000 | ORAL_TABLET | ORAL | Status: DC | PRN

## 2014-12-22 MED ORDER — FERROUS SULFATE 325 (65 FE) MG PO TBEC: 325.0000 mg | DELAYED_RELEASE_TABLET | Freq: Two times a day (BID) | ORAL | Status: DC

## 2014-12-22 NOTE — Lactation Note (Signed)
This note was copied from the chart of [place]. Lactation Consultation Note  Patient Name: Tiffany Chavez Today's Date: 12/22/2014 Reason for consult: Follow-up assessment Baby 50 hours of life. Asked to see baby due to feeding difficulty and baby's 8% weight loss. Assisted mom to latch baby in football position to right breast. Mom has very little colostrum when hand expressing. Baby latches and suckles a few times, then very sleepy at breast. Mom agrees to start pumping. Mom attempted to nurse baby at left breast, and baby did suckle briefly, then fell asleep. Mom's left nipple appears cracked. Enc use of EBM. Started mom pumping. Baby's membranes appear dry and baby fussy when removed from mom's breast. Showed both parents the small crease at the end of the baby's tongue when baby extending tongue across gumline. Discussed possible tight frenulum. Enc parents to discuss with pediatrician if mom's nipple discomfort continues after her milk comes in. FOB supplemented baby with Alimentum while mom pumped. Enc parents to put baby to breast first with cues, and at least every 3 hours. Enc parents to supplement with EBM/formula according to guidelines, which were given, and enc mom to post-pump after each feeding/every 2-3 hours for 15 minutes. Enc mom to hand express before latching baby and after pumping. Mom aware to give EBM first and make up the difference with formula. Enc parents to increase supplementation amounts by 5 mls at each feeding and feed again sooner rather than overfeed at a single feeding. Enc parents to call out for assistance as needed.   Maternal Data Has patient been taught Hand Expression?: Yes Does the patient have breastfeeding experience prior to this delivery?: No  Feeding Feeding Type: Breast Fed Length of feed: 10 min (off and on, baby sleepy at breast.)  LATCH Score/Interventions Latch: Repeated attempts needed to sustain latch, nipple  held in mouth throughout feeding, stimulation needed to elicit sucking reflex. Intervention(s): Skin to skin;Waking techniques Intervention(s): Adjust position;Assist with latch;Breast compression  Audible Swallowing: None Intervention(s): Skin to skin;Hand expression  Type of Nipple: Everted at rest and after stimulation  Comfort (Breast/Nipple): Filling, red/small blisters or bruises, mild/mod discomfort  Problem noted: Mild/Moderate discomfort Interventions (Mild/moderate discomfort): Post-pump  Hold (Positioning): Assistance needed to correctly position infant at breast and maintain latch. Intervention(s): Breastfeeding basics reviewed;Support Pillows;Position options;Skin to skin  LATCH Score: 5  Lactation Tools Discussed/Used Tools: Pump;Bottle Breast pump type: Double-Electric Breast Pump Pump Review: Setup, frequency, and cleaning;Milk Storage Initiated by:: JW Date initiated:: 12/22/14   Consult Status Consult Status: Follow-up Date: 12/23/14 Follow-up type: In-patient    Inocente Salles 12/22/2014, 6:46 PM

## 2014-12-23 ENCOUNTER — Ambulatory Visit: Payer: Self-pay

## 2014-12-23 NOTE — Lactation Note (Signed)
This note was copied from the chart of [place]. Lactation Consultation Note Follow up visit prior to discharge.  Mom states baby is latching well and nurses for 10-15 minutes then falls asleep.  Reviewed waking techniques and encouraged to nurse skin to skin.  Mom is post pumping and obtaining drops.  She is supplementing with 30 mls of formula per bottle.  [facility] loaner completed.  Instructed to continue plan and supplementation until milk is fully in.  Encouraged outpatient services and support.  Patient Name: Tiffany Chavez Today's Date: 12/23/2014     Maternal Data    Feeding Feeding Type: Breast Fed Length of feed: 16 min  LATCH Score/Interventions                      Lactation Tools Discussed/Used     Consult Status      Ave Filter 12/23/2014, 2:17 PM

## 2016-08-03 NOTE — L&D Delivery Note (Signed)
Patient is a 26 y.o. now [id number] who admitted for IOL for postdates, now s/p NSVD at [redacted]w[redacted]d  Delivery Note At 6:47 PM a viable female was delivered via Vaginal, Spontaneous Delivery  Presentation: LOA   APGAR: 9, 9; weight  .   Placenta status: intact.   Cord 3-vessel  Anesthesia:  epidural Episiotomy: None Lacerations: None Suture Repair: none Est. Blood Loss (mL): 100  Patient delivered precipitously while getting epidural. Arrived in room while head being delivered. Body followed spontaneously. Cord clamped x 2 after 1-minute delay, and cut by family member. Cord blood drawn. Placenta delivered spontaneously with gentle cord traction. Fundus firm with massage and Pitocin. Perineum inspected and found to have no laceration.  Mom to postpartum.  Baby to Couplet care / Skin to Skin.  Almyra Free P. Gabriel Conry, MD OB Fellow 03/06/17, 8:40 PM

## 2016-09-03 ENCOUNTER — Encounter: Payer: Self-pay | Admitting: Obstetrics & Gynecology

## 2016-09-03 ENCOUNTER — Ambulatory Visit (INDEPENDENT_AMBULATORY_CARE_PROVIDER_SITE_OTHER): Payer: Medicaid Other | Admitting: Obstetrics & Gynecology

## 2016-09-03 ENCOUNTER — Other Ambulatory Visit (HOSPITAL_COMMUNITY)
Admission: RE | Admit: 2016-09-03 | Discharge: 2016-09-03 | Disposition: A | Discharge status: Home / Self Care | Payer: Medicaid Other | Source: Ambulatory Visit | Attending: Obstetrics & Gynecology | Admitting: Obstetrics & Gynecology

## 2016-09-03 VITALS — BP 120/74 | HR 97 | Ht 61.0 in | Wt 138.0 lb

## 2016-09-03 DIAGNOSIS — Z113 Encounter for screening for infections with a predominantly sexual mode of transmission: Secondary | ICD-10-CM | POA: Diagnosis not present

## 2016-09-03 DIAGNOSIS — Z3490 Encounter for supervision of normal pregnancy, unspecified, unspecified trimester: Secondary | ICD-10-CM | POA: Insufficient documentation

## 2016-09-03 DIAGNOSIS — Z3492 Encounter for supervision of normal pregnancy, unspecified, second trimester: Secondary | ICD-10-CM | POA: Diagnosis not present

## 2016-09-03 DIAGNOSIS — Z3689 Encounter for other specified antenatal screening: Secondary | ICD-10-CM

## 2016-09-03 DIAGNOSIS — O2342 Unspecified infection of urinary tract in pregnancy, second trimester: Secondary | ICD-10-CM

## 2016-09-03 DIAGNOSIS — O219 Vomiting of pregnancy, unspecified: Secondary | ICD-10-CM

## 2016-09-03 MED ORDER — PROMETHAZINE HCL 25 MG PO TABS: 25.0000 mg | ORAL_TABLET | Freq: Four times a day (QID) | ORAL | 2 refills | Status: DC | PRN

## 2016-09-03 NOTE — Patient Instructions (Addendum)
Thank you for enrolling in [place]. Please follow the instructions below to securely access your online medical record. MyChart allows you to send messages to your doctor, view your test results, manage appointments, and more.   How Do I Sign Up? 1. In your Internet browser, go to [employer] and enter [email or web address]. 2. Click on the Sign Up Now link in the Sign In box. You will see the New Member Sign Up page. 3. Enter your MyChart Access Code exactly as it appears below. You will not need to use this code after you've completed the sign-up process. If you do not sign up before the expiration date, you must request a new code.  MyChart Access Code: [email or web address] Expires: 11/02/2016 10:50 AM  4. Enter your Social Security Number ([id number]) and Date of Birth (mm/dd/yyyy) as indicated and click Submit. You will be taken to the next sign-up page. 5. Create a MyChart ID. This will be your MyChart login ID and cannot be changed, so think of one that is secure and easy to remember. 6. Create a MyChart password. You can change your password at any time. 7. Enter your Password Reset Question and Answer. This can be used at a later time if you forget your password.  8. Enter your e-mail address. You will receive e-mail notification when new information is available in [place]. 9. Click Sign Up. You can now view your medical record.   Additional Information Remember, MyChart is NOT to be used for urgent needs. For medical emergencies, dial 911.     Second Trimester of Pregnancy The second trimester is from week 13 through week 28 (months 4 through 6). The second trimester is often a time when you feel your best. Your body has also adjusted to being pregnant, and you begin to feel better physically. Usually, morning sickness has lessened or quit completely, you may have more energy, and you may have an increase in appetite. The second trimester is also a time when the fetus is  growing rapidly. At the end of the sixth month, the fetus is about 9 inches long and weighs about 1 pounds. You will likely begin to feel the baby move (quickening) between 18 and 20 weeks of the pregnancy. Body changes during your second trimester Your body continues to go through many changes during your second trimester. The changes vary from woman to woman.  Your weight will continue to increase. You will notice your lower abdomen bulging out.  You may begin to get stretch marks on your hips, abdomen, and breasts.  You may develop headaches that can be relieved by medicines. The medicines should be approved by your health care provider.  You may urinate more often because the fetus is pressing on your bladder.  You may develop or continue to have heartburn as a result of your pregnancy.  You may develop constipation because certain hormones are causing the muscles that push waste through your intestines to slow down.  You may develop hemorrhoids or swollen, bulging veins (varicose veins).  You may have back pain. This is caused by:  Weight gain.  Pregnancy hormones that are relaxing the joints in your pelvis.  A shift in weight and the muscles that support your balance.  Your breasts will continue to grow and they will continue to become tender.  Your gums may bleed and may be sensitive to brushing and flossing.  Dark spots or blotches (chloasma, mask of pregnancy) may develop on your face. This will likely  fade after the baby is born.  A dark line from your belly button to the pubic area (linea nigra) may appear. This will likely fade after the baby is born.  You may have changes in your hair. These can include thickening of your hair, rapid growth, and changes in texture. Some women also have hair loss during or after pregnancy, or hair that feels dry or thin. Your hair will most likely return to normal after your baby is born. What to expect at prenatal visits During a  routine prenatal visit:  You will be weighed to make sure you and the fetus are growing normally.  Your blood pressure will be taken.  Your abdomen will be measured to track your baby's growth.  The fetal heartbeat will be listened to.  Any test results from the previous visit will be discussed. Your health care provider may ask you:  How you are feeling.  If you are feeling the baby move.  If you have had any abnormal symptoms, such as leaking fluid, bleeding, severe headaches, or abdominal cramping.  If you are using any tobacco products, including cigarettes, chewing tobacco, and electronic cigarettes.  If you have any questions. Other tests that may be performed during your second trimester include:  Blood tests that check for:  Low iron levels (anemia).  Gestational diabetes (between 24 and 28 weeks).  Rh antibodies. This is to check for a protein on red blood cells (Rh factor).  Urine tests to check for infections, diabetes, or protein in the urine.  An ultrasound to confirm the proper growth and development of the baby.  An amniocentesis to check for possible genetic problems.  Fetal screens for spina bifida and Down syndrome.  HIV (human immunodeficiency virus) testing. Routine prenatal testing includes screening for HIV, unless you choose not to have this test. Follow these instructions at home: Eating and drinking  Continue to eat regular, healthy meals.  Avoid raw meat, uncooked cheese, cat litter boxes, and soil used by cats. These carry germs that can cause birth defects in the baby.  Take your prenatal vitamins.  Take 1500-2000 mg of calcium daily starting at the 20th week of pregnancy until you deliver your baby.  If you develop constipation:  Take over-the-counter or prescription medicines.  Drink enough fluid to keep your urine clear or pale yellow.  Eat foods that are high in fiber, such as fresh fruits and vegetables, whole grains, and  beans.  Limit foods that are high in fat and processed sugars, such as fried and sweet foods. Activity  Exercise only as directed by your health care provider. Experiencing uterine cramps is a good sign to stop exercising.  Avoid heavy lifting, wear low heel shoes, and practice good posture.  Wear your seat belt at all times when driving.  Rest with your legs elevated if you have leg cramps or low back pain.  Wear a good support bra for breast tenderness.  Do not use hot tubs, steam rooms, or saunas. Lifestyle  Avoid all smoking, herbs, alcohol, and unprescribed drugs. These chemicals affect the formation and growth of the baby.  Do not use any products that contain nicotine or tobacco, such as cigarettes and e-cigarettes. If you need help quitting, ask your health care provider.  A sexual relationship may be continued unless your health care provider directs you otherwise. General instructions  Follow your health care provider's instructions regarding medicine use. There are medicines that are either safe or unsafe to take  during pregnancy.  Take warm sitz baths to soothe any pain or discomfort caused by hemorrhoids. Use hemorrhoid cream if your health care provider approves.  If you develop varicose veins, wear support hose. Elevate your feet for 15 minutes, 3-4 times a day. Limit salt in your diet.  Visit your dentist if you have not gone yet during your pregnancy. Use a soft toothbrush to brush your teeth and be gentle when you floss.  Keep all follow-up prenatal visits as told by your health care provider. This is important. Contact a health care provider if:  You have dizziness.  You have mild pelvic cramps, pelvic pressure, or nagging pain in the abdominal area.  You have persistent nausea, vomiting, or diarrhea.  You have a bad smelling vaginal discharge.  You have pain with urination. Get help right away if:  You have a fever.  You are leaking fluid from your  vagina.  You have spotting or bleeding from your vagina.  You have severe abdominal cramping or pain.  You have rapid weight gain or weight loss.  You have shortness of breath with chest pain.  You notice sudden or extreme swelling of your face, hands, ankles, feet, or legs.  You have not felt your baby move in over an hour.  You have severe headaches that do not go away with medicine.  You have vision changes. Summary  The second trimester is from week 13 through week 28 (months 4 through 6). It is also a time when the fetus is growing rapidly.  Your body goes through many changes during pregnancy. The changes vary from woman to woman.  Avoid all smoking, herbs, alcohol, and unprescribed drugs. These chemicals affect the formation and growth your baby.  Do not use any tobacco products, such as cigarettes, chewing tobacco, and e-cigarettes. If you need help quitting, ask your health care provider.  Contact your health care provider if you have any questions. Keep all prenatal visits as told by your health care provider. This is important. This information is not intended to replace advice given to you by your health care provider. Make sure you discuss any questions you have with your health care provider. Document Released: 07/14/2001 Document Revised: 12/26/2015 Document Reviewed: 09/20/2012 Elsevier Interactive Patient Education  2017 [employer].

## 2016-09-03 NOTE — Progress Notes (Signed)
Subjective:   Tiffany Chavez is a 26 y.o. G2P1001 at [redacted]w[redacted]d by LMP, early ultrasound being seen today for her first obstetrical visit.  Her obstetrical history is significant for term SVD. Patient does intend to breast feed. Pregnancy history fully reviewed.  Patient reports occasional nausea and vomiting.  HISTORY: Obstetric History   G2   P1   T1   P0   A0   L1    SAB0   TAB0   Ectopic0   Multiple0   Live Births1     # Outcome Date GA Lbr Len/2nd Weight Sex Delivery Anes PTL Lv  2 Current           1 Term 12/20/14 [redacted]w[redacted]d 15:15 / 00:34 7 lb 3 oz (3.26 kg) M Vag-Spont EPI  LIV     Apgar1:  8                Apgar5: 9     History reviewed. No pertinent past medical history. Past Surgical History:  Procedure Laterality Date  . RHINOPLASTY     No family history on file. Social History  Substance Use Topics  . Smoking status: Never Smoker  . Smokeless tobacco: Never Used  . Alcohol use No   No Known Allergies Current Outpatient Prescriptions on File Prior to Visit  Medication Sig Dispense Refill  . Prenatal Vit-Fe Fumarate-FA (MULTIVITAMIN-PRENATAL) 27-0.8 MG TABS tablet Take 1 tablet by mouth daily at 12 noon.     No current facility-administered medications on file prior to visit.      Exam   Vitals:   09/03/16 1003 09/03/16 1004  BP: 120/74   Pulse: 97   Weight: 138 lb (62.6 kg)   Height:  5\' 1"  (1.549 m)      Uterus:     Pelvic Exam: Perineum: no hemorrhoids, normal perineum   Vulva: normal external genitalia, no lesions   Vagina:  normal mucosa, normal discharge   Cervix: no lesions and normal   Adnexa: normal adnexa and no mass, fullness, tenderness   Bony Pelvis: average  System: General: well-developed, well-nourished female in no acute distress   Breast:  normal appearance, no masses or tenderness   Skin: normal coloration and turgor, no rashes   Neurologic: oriented, normal, negative, normal mood   Extremities: normal strength, tone,  and muscle mass, ROM of all joints is normal   HEENT PERRLA, extraocular movement intact and sclera clear, anicteric   Mouth/Teeth mucous membranes moist, pharynx normal without lesions and dental hygiene good   Neck supple and no masses   Cardiovascular: regular rate and rhythm   Respiratory:  no respiratory distress, normal breath sounds   Abdomen: soft, non-tender; bowel sounds normal; no masses,  no organomegaly     Assessment:   Pregnancy: G2P1001 Patient Active Problem List   Diagnosis Date Noted  . Supervision of normal pregnancy, antepartum 09/03/2016     Plan:   1. Nausea and vomiting of pregnancy, antepartum Phenergan prescribed as needed - promethazine (PHENERGAN) 25 MG tablet; Take 1 tablet (25 mg total) by mouth every 6 (six) hours as needed for nausea or vomiting.  Dispense: 30 tablet; Refill: 2  2. Encounter for fetal anatomic survey Anatomy scan ordered - US MFM OB COMP + 14 WK; Future  3. Encounter for supervision of normal pregnancy, antepartum, unspecified gravidity Initial labs drawn including quad screen. - Culture, OB Urine - ToxASSURE Select 13 (MW), Urine - Obstetric Panel, Including HIV -  Hemoglobinopathy evaluation - Cystic Fibrosis Mutation 97 - Urine cytology ancillary only - AFP, Quad Screen - [place] MFM OB COMP + 73 WK; Future Continue prenatal vitamins. Problem list reviewed and updated. The nature of [place] with multiple MDs and other Advanced Practice Providers was explained to patient; also emphasized that residents, students are part of our team. Routine obstetric precautions reviewed. Enrolled in [employer]. Return in about 5 weeks (around 10/08/2016) for OB Visit (20 weeks [employer]).     Verita Schneiders, MD, [place] Attending [place], [facility] for [employer], [place]

## 2016-09-03 NOTE — Progress Notes (Signed)
Last Pap around 05/2016 at [facility] - pt states it was abnormal but not sure of exact result. She will  Bedside US shows single IUP measuring [redacted]w[redacted]d with FHR 140bpm

## 2016-09-04 LAB — URINE CYTOLOGY ANCILLARY ONLY
Chlamydia: NEGATIVE
Neisseria Gonorrhea: NEGATIVE

## 2016-09-08 DIAGNOSIS — O2340 Unspecified infection of urinary tract in pregnancy, unspecified trimester: Secondary | ICD-10-CM | POA: Insufficient documentation

## 2016-09-08 DIAGNOSIS — O2342 Unspecified infection of urinary tract in pregnancy, second trimester: Secondary | ICD-10-CM | POA: Insufficient documentation

## 2016-09-08 LAB — URINE CULTURE, OB REFLEX

## 2016-09-08 LAB — CULTURE, OB URINE

## 2016-09-08 MED ORDER — AMOXICILLIN 500 MG PO CAPS: 500.0000 mg | ORAL_CAPSULE | Freq: Three times a day (TID) | ORAL | 2 refills | Status: DC

## 2016-09-08 NOTE — Addendum Note (Signed)
Addended by: Verita Schneiders A on: 09/08/2016 01:04 PM   Modules accepted: Orders

## 2016-09-10 LAB — TOXASSURE SELECT 13 (MW), URINE

## 2016-09-15 LAB — AFP, QUAD SCREEN
DIA Mom Value: 1.4
DIA Value (EIA): 280.75 pg/mL
DSR (By Age)    1 IN: 1071
DSR (SECOND TRIMESTER) 1 IN: 473
Gestational Age: 15.3 WEEKS
MSAFP MOM: 0.7
MSAFP: 21.6 ng/mL
MSHCG MOM: 1.52
MSHCG: 78051 m[IU]/mL
Maternal Age At EDD: 24 YEARS
Osb Risk: 10000
Test Results:: NEGATIVE
UE3 VALUE: 0.48 ng/mL
Weight: 138 [lb_av]
uE3 Mom: 0.71

## 2016-09-15 LAB — OBSTETRIC PANEL, INCLUDING HIV
ANTIBODY SCREEN: NEGATIVE
BASOS: 0 %
Basophils Absolute: 0 10*3/uL (ref 0.0–0.2)
EOS (ABSOLUTE): 0.5 10*3/uL — ABNORMAL HIGH (ref 0.0–0.4)
EOS: 5 %
HEMATOCRIT: 36.9 % (ref 34.0–46.6)
HEMOGLOBIN: 12.7 g/dL (ref 11.1–15.9)
HIV SCREEN 4TH GENERATION: NONREACTIVE
Hepatitis B Surface Ag: NEGATIVE
Immature Grans (Abs): 0 10*3/uL (ref 0.0–0.1)
Immature Granulocytes: 0 %
Lymphocytes Absolute: 2.2 10*3/uL (ref 0.7–3.1)
Lymphs: 24 %
MCH: 29.4 pg (ref 26.6–33.0)
MCHC: 34.4 g/dL (ref 31.5–35.7)
MCV: 85 fL (ref 79–97)
MONOCYTES: 8 %
Monocytes Absolute: 0.7 10*3/uL (ref 0.1–0.9)
NEUTROS ABS: 5.9 10*3/uL (ref 1.4–7.0)
Neutrophils: 63 %
Platelets: 258 10*3/uL (ref 150–379)
RBC: 4.32 x10E6/uL (ref 3.77–5.28)
RDW: 13.2 % (ref 12.3–15.4)
RH TYPE: POSITIVE
RPR Ser Ql: NONREACTIVE
RUBELLA: 1.98 {index} (ref >0.99)
WBC: 9.4 10*3/uL (ref 3.4–10.8)

## 2016-09-15 LAB — HEMOGLOBINOPATHY EVALUATION
HEMOGLOBIN A2 QUANTITATION: 2.6 % (ref 1.8–3.2)
HGB C: 0 %
HGB S: 0 %
Hemoglobin F Quantitation: 0 % (ref 0.0–2.0)
Hgb A: 97.4 % (ref 96.4–98.8)

## 2016-09-15 LAB — CYSTIC FIBROSIS MUTATION 97: GENE DIS ANAL CARRIER INTERP BLD/T-IMP: NOT DETECTED

## 2016-09-21 ENCOUNTER — Encounter (HOSPITAL_COMMUNITY): Payer: Self-pay | Admitting: Obstetrics & Gynecology

## 2016-09-22 ENCOUNTER — Telehealth: Payer: Self-pay | Admitting: *Deleted

## 2016-09-22 DIAGNOSIS — Z3482 Encounter for supervision of other normal pregnancy, second trimester: Secondary | ICD-10-CM

## 2016-09-22 MED ORDER — CONCEPT DHA 53.5-38-1 MG PO CAPS: 1.0000 | ORAL_CAPSULE | Freq: Every day | ORAL | 8 refills | Status: AC

## 2016-09-22 NOTE — Telephone Encounter (Signed)
Sent PNV to pharmacy.

## 2016-09-22 NOTE — Telephone Encounter (Signed)
-----   Message from Blanchie Dessert, [place] sent at 09/21/2016  4:39 PM EST ----- Regarding: pt requesting Rx Contact: [phone] Pt would like to have prenatal vit called into [facility] on [address]

## 2016-09-28 ENCOUNTER — Ambulatory Visit (HOSPITAL_COMMUNITY)
Admission: RE | Admit: 2016-09-28 | Discharge: 2016-09-28 | Disposition: A | Discharge status: Home / Self Care | Payer: Medicaid Other | Source: Ambulatory Visit | Attending: Obstetrics & Gynecology | Admitting: Obstetrics & Gynecology

## 2016-09-28 DIAGNOSIS — Z363 Encounter for antenatal screening for malformations: Secondary | ICD-10-CM | POA: Insufficient documentation

## 2016-09-28 DIAGNOSIS — Z3A17 17 weeks gestation of pregnancy: Secondary | ICD-10-CM | POA: Insufficient documentation

## 2016-09-28 DIAGNOSIS — Z3490 Encounter for supervision of normal pregnancy, unspecified, unspecified trimester: Secondary | ICD-10-CM

## 2016-09-28 DIAGNOSIS — Z3689 Encounter for other specified antenatal screening: Secondary | ICD-10-CM

## 2016-10-19 ENCOUNTER — Ambulatory Visit (INDEPENDENT_AMBULATORY_CARE_PROVIDER_SITE_OTHER): Payer: Medicaid Other | Admitting: Family Medicine

## 2016-10-19 VITALS — BP 114/70 | HR 97 | Wt 145.0 lb

## 2016-10-19 DIAGNOSIS — O2342 Unspecified infection of urinary tract in pregnancy, second trimester: Secondary | ICD-10-CM

## 2016-10-19 DIAGNOSIS — Z3482 Encounter for supervision of other normal pregnancy, second trimester: Secondary | ICD-10-CM

## 2016-10-19 MED ORDER — AMOXICILLIN 500 MG PO CAPS: 500.0000 mg | ORAL_CAPSULE | Freq: Three times a day (TID) | ORAL | 2 refills | Status: DC

## 2016-10-19 NOTE — Patient Instructions (Signed)
 Second Trimester of Pregnancy The second trimester is from week 14 through week 27 (months 4 through 6). The second trimester is often a time when you feel your best. Your body has adjusted to being pregnant, and you begin to feel better physically. Usually, morning sickness has lessened or quit completely, you may have more energy, and you may have an increase in appetite. The second trimester is also a time when the fetus is growing rapidly. At the end of the sixth month, the fetus is about 9 inches long and weighs about 1 pounds. You will likely begin to feel the baby move (quickening) between 16 and 20 weeks of pregnancy. Body changes during your second trimester Your body continues to go through many changes during your second trimester. The changes vary from woman to woman.  Your weight will continue to increase. You will notice your lower abdomen bulging out.  You may begin to get stretch marks on your hips, abdomen, and breasts.  You may develop headaches that can be relieved by medicines. The medicines should be approved by your health care provider.  You may urinate more often because the fetus is pressing on your bladder.  You may develop or continue to have heartburn as a result of your pregnancy.  You may develop constipation because certain hormones are causing the muscles that push waste through your intestines to slow down.  You may develop hemorrhoids or swollen, bulging veins (varicose veins).  You may have back pain. This is caused by: ? Weight gain. ? Pregnancy hormones that are relaxing the joints in your pelvis. ? A shift in weight and the muscles that support your balance.  Your breasts will continue to grow and they will continue to become tender.  Your gums may bleed and may be sensitive to brushing and flossing.  Dark spots or blotches (chloasma, mask of pregnancy) may develop on your face. This will likely fade after the baby is born.  A dark line from  your belly button to the pubic area (linea nigra) may appear. This will likely fade after the baby is born.  You may have changes in your hair. These can include thickening of your hair, rapid growth, and changes in texture. Some women also have hair loss during or after pregnancy, or hair that feels dry or thin. Your hair will most likely return to normal after your baby is born.  What to expect at prenatal visits During a routine prenatal visit:  You will be weighed to make sure you and the fetus are growing normally.  Your blood pressure will be taken.  Your abdomen will be measured to track your baby's growth.  The fetal heartbeat will be listened to.  Any test results from the previous visit will be discussed.  Your health care provider may ask you:  How you are feeling.  If you are feeling the baby move.  If you have had any abnormal symptoms, such as leaking fluid, bleeding, severe headaches, or abdominal cramping.  If you are using any tobacco products, including cigarettes, chewing tobacco, and electronic cigarettes.  If you have any questions.  Other tests that may be performed during your second trimester include:  Blood tests that check for: ? Low iron levels (anemia). ? High blood sugar that affects pregnant women (gestational diabetes) between 24 and 28 weeks. ? Rh antibodies. This is to check for a protein on red blood cells (Rh factor).  Urine tests to check for infections, diabetes,   or protein in the urine.  An ultrasound to confirm the proper growth and development of the baby.  An amniocentesis to check for possible genetic problems.  Fetal screens for spina bifida and Down syndrome.  HIV (human immunodeficiency virus) testing. Routine prenatal testing includes screening for HIV, unless you choose not to have this test.  Follow these instructions at home: Medicines  Follow your health care provider's instructions regarding medicine use. Specific  medicines may be either safe or unsafe to take during pregnancy.  Take a prenatal vitamin that contains at least 600 micrograms (mcg) of folic acid.  If you develop constipation, try taking a stool softener if your health care provider approves. Eating and drinking  Eat a balanced diet that includes fresh fruits and vegetables, whole grains, good sources of protein such as meat, eggs, or tofu, and low-fat dairy. Your health care provider will help you determine the amount of weight gain that is right for you.  Avoid raw meat and uncooked cheese. These carry germs that can cause birth defects in the baby.  If you have low calcium intake from food, talk to your health care provider about whether you should take a daily calcium supplement.  Limit foods that are high in fat and processed sugars, such as fried and sweet foods.  To prevent constipation: ? Drink enough fluid to keep your urine clear or pale yellow. ? Eat foods that are high in fiber, such as fresh fruits and vegetables, whole grains, and beans. Activity  Exercise only as directed by your health care provider. Most women can continue their usual exercise routine during pregnancy. Try to exercise for 30 minutes at least 5 days a week. Stop exercising if you experience uterine contractions.  Avoid heavy lifting, wear low heel shoes, and practice good posture.  A sexual relationship may be continued unless your health care provider directs you otherwise. Relieving pain and discomfort  Wear a good support bra to prevent discomfort from breast tenderness.  Take warm sitz baths to soothe any pain or discomfort caused by hemorrhoids. Use hemorrhoid cream if your health care provider approves.  Rest with your legs elevated if you have leg cramps or low back pain.  If you develop varicose veins, wear support hose. Elevate your feet for 15 minutes, 3-4 times a day. Limit salt in your diet. Prenatal Care  Write down your questions.  Take them to your prenatal visits.  Keep all your prenatal visits as told by your health care provider. This is important. Safety  Wear your seat belt at all times when driving.  Make a list of emergency phone numbers, including numbers for family, friends, the hospital, and police and fire departments. General instructions  Ask your health care provider for a referral to a local prenatal education class. Begin classes no later than the beginning of month 6 of your pregnancy.  Ask for help if you have counseling or nutritional needs during pregnancy. Your health care provider can offer advice or refer you to specialists for help with various needs.  Do not use hot tubs, steam rooms, or saunas.  Do not douche or use tampons or scented sanitary pads.  Do not cross your legs for long periods of time.  Avoid cat litter boxes and soil used by cats. These carry germs that can cause birth defects in the baby and possibly loss of the fetus by miscarriage or stillbirth.  Avoid all smoking, herbs, alcohol, and unprescribed drugs. Chemicals in these products   can affect the formation and growth of the baby.  Do not use any products that contain nicotine or tobacco, such as cigarettes and e-cigarettes. If you need help quitting, ask your health care provider.  Visit your dentist if you have not gone yet during your pregnancy. Use a soft toothbrush to brush your teeth and be gentle when you floss. Contact a health care provider if:  You have dizziness.  You have mild pelvic cramps, pelvic pressure, or nagging pain in the abdominal area.  You have persistent nausea, vomiting, or diarrhea.  You have a bad smelling vaginal discharge.  You have pain when you urinate. Get help right away if:  You have a fever.  You are leaking fluid from your vagina.  You have spotting or bleeding from your vagina.  You have severe abdominal cramping or pain.  You have rapid weight gain or weight  loss.  You have shortness of breath with chest pain.  You notice sudden or extreme swelling of your face, hands, ankles, feet, or legs.  You have not felt your baby move in over an hour.  You have severe headaches that do not go away when you take medicine.  You have vision changes. Summary  The second trimester is from week 14 through week 27 (months 4 through 6). It is also a time when the fetus is growing rapidly.  Your body goes through many changes during pregnancy. The changes vary from woman to woman.  Avoid all smoking, herbs, alcohol, and unprescribed drugs. These chemicals affect the formation and growth your baby.  Do not use any tobacco products, such as cigarettes, chewing tobacco, and e-cigarettes. If you need help quitting, ask your health care provider.  Contact your health care provider if you have any questions. Keep all prenatal visits as told by your health care provider. This is important. This information is not intended to replace advice given to you by your health care provider. Make sure you discuss any questions you have with your health care provider. Document Released: 07/14/2001 Document Revised: 12/26/2015 Document Reviewed: 09/20/2012 [employer] Interactive Patient Education  2017 [employer]   Breastfeeding Deciding to breastfeed is one of the best choices you can make for you and your baby. A change in hormones during pregnancy causes your breast tissue to grow and increases the number and size of your milk ducts. These hormones also allow proteins, sugars, and fats from your blood supply to make breast milk in your milk-producing glands. Hormones prevent breast milk from being released before your baby is born as well as prompt milk flow after birth. Once breastfeeding has begun, thoughts of your baby, as well as his or her sucking or crying, can stimulate the release of milk from your milk-producing glands. Benefits of breastfeeding For Your  Baby  Your first milk (colostrum) helps your baby's digestive system function better.  There are antibodies in your milk that help your baby fight off infections.  Your baby has a lower incidence of asthma, allergies, and sudden infant death syndrome.  The nutrients in breast milk are better for your baby than infant formulas and are designed uniquely for your baby's needs.  Breast milk improves your baby's brain development.  Your baby is less likely to develop other conditions, such as childhood obesity, asthma, or type 2 diabetes mellitus.  For You  Breastfeeding helps to create a very special bond between you and your baby.  Breastfeeding is convenient. Breast milk is always available at   the correct temperature and costs nothing.  Breastfeeding helps to burn calories and helps you lose the weight gained during pregnancy.  Breastfeeding makes your uterus contract to its prepregnancy size faster and slows bleeding (lochia) after you give birth.  Breastfeeding helps to lower your risk of developing type 2 diabetes mellitus, osteoporosis, and breast or ovarian cancer later in life.  Signs that your baby is hungry Early Signs of Hunger  Increased alertness or activity.  Stretching.  Movement of the head from side to side.  Movement of the head and opening of the mouth when the corner of the mouth or cheek is stroked (rooting).  Increased sucking sounds, smacking lips, cooing, sighing, or squeaking.  Hand-to-mouth movements.  Increased sucking of fingers or hands.  Late Signs of Hunger  Fussing.  Intermittent crying.  Extreme Signs of Hunger Signs of extreme hunger will require calming and consoling before your baby will be able to breastfeed successfully. Do not wait for the following signs of extreme hunger to occur before you initiate breastfeeding:  Restlessness.  A loud, strong cry.  Screaming.  Breastfeeding basics Breastfeeding Initiation  Find a  comfortable place to sit or lie down, with your neck and back well supported.  Place a pillow or rolled up blanket under your baby to bring him or her to the level of your breast (if you are seated). Nursing pillows are specially designed to help support your arms and your baby while you breastfeed.  Make sure that your baby's abdomen is facing your abdomen.  Gently massage your breast. With your fingertips, massage from your chest wall toward your nipple in a circular motion. This encourages milk flow. You may need to continue this action during the feeding if your milk flows slowly.  Support your breast with 4 fingers underneath and your thumb above your nipple. Make sure your fingers are well away from your nipple and your baby's mouth.  Stroke your baby's lips gently with your finger or nipple.  When your baby's mouth is open wide enough, quickly bring your baby to your breast, placing your entire nipple and as much of the colored area around your nipple (areola) as possible into your baby's mouth. ? More areola should be visible above your baby's upper lip than below the lower lip. ? Your baby's tongue should be between his or her lower gum and your breast.  Ensure that your baby's mouth is correctly positioned around your nipple (latched). Your baby's lips should create a seal on your breast and be turned out (everted).  It is common for your baby to suck about 2-3 minutes in order to start the flow of breast milk.  Latching Teaching your baby how to latch on to your breast properly is very important. An improper latch can cause nipple pain and decreased milk supply for you and poor weight gain in your baby. Also, if your baby is not latched onto your nipple properly, he or she may swallow some air during feeding. This can make your baby fussy. Burping your baby when you switch breasts during the feeding can help to get rid of the air. However, teaching your baby to latch on properly is  still the best way to prevent fussiness from swallowing air while breastfeeding. Signs that your baby has successfully latched on to your nipple:  Silent tugging or silent sucking, without causing you pain.  Swallowing heard between every 3-4 sucks.  Muscle movement above and in front of his or her   ears while sucking.  Signs that your baby has not successfully latched on to nipple:  Sucking sounds or smacking sounds from your baby while breastfeeding.  Nipple pain.  If you think your baby has not latched on correctly, slip your finger into the corner of your baby's mouth to break the suction and place it between your baby's gums. Attempt breastfeeding initiation again. Signs of Successful Breastfeeding Signs from your baby:  A gradual decrease in the number of sucks or complete cessation of sucking.  Falling asleep.  Relaxation of his or her body.  Retention of a small amount of milk in his or her mouth.  Letting go of your breast by himself or herself.  Signs from you:  Breasts that have increased in firmness, weight, and size 1-3 hours after feeding.  Breasts that are softer immediately after breastfeeding.  Increased milk volume, as well as a change in milk consistency and color by the fifth day of breastfeeding.  Nipples that are not sore, cracked, or bleeding.  Signs That Your Baby is Getting Enough Milk  Wetting at least 1-2 diapers during the first 24 hours after birth.  Wetting at least 5-6 diapers every 24 hours for the first week after birth. The urine should be clear or pale yellow by 5 days after birth.  Wetting 6-8 diapers every 24 hours as your baby continues to grow and develop.  At least 3 stools in a 24-hour period by age 5 days. The stool should be soft and yellow.  At least 3 stools in a 24-hour period by age 7 days. The stool should be seedy and yellow.  No loss of weight greater than 10% of birth weight during the first 3 days of age.  Average  weight gain of 4-7 ounces (113-198 g) per week after age 4 days.  Consistent daily weight gain by age 5 days, without weight loss after the age of 2 weeks.  After a feeding, your baby may spit up a small amount. This is common. Breastfeeding frequency and duration Frequent feeding will help you make more milk and can prevent sore nipples and breast engorgement. Breastfeed when you feel the need to reduce the fullness of your breasts or when your baby shows signs of hunger. This is called "breastfeeding on demand." Avoid introducing a pacifier to your baby while you are working to establish breastfeeding (the first 4-6 weeks after your baby is born). After this time you may choose to use a pacifier. Research has shown that pacifier use during the first year of a baby's life decreases the risk of sudden infant death syndrome (SIDS). Allow your baby to feed on each breast as long as he or she wants. Breastfeed until your baby is finished feeding. When your baby unlatches or falls asleep while feeding from the first breast, offer the second breast. Because newborns are often sleepy in the first few weeks of life, you may need to awaken your baby to get him or her to feed. Breastfeeding times will vary from baby to baby. However, the following rules can serve as a guide to help you ensure that your baby is properly fed:  Newborns (babies 4 weeks of age or younger) may breastfeed every 1-3 hours.  Newborns should not go longer than 3 hours during the day or 5 hours during the night without breastfeeding.  You should breastfeed your baby a minimum of 8 times in a 24-hour period until you begin to introduce solid foods to your   baby at around 6 months of age.  Breast milk pumping Pumping and storing breast milk allows you to ensure that your baby is exclusively fed your breast milk, even at times when you are unable to breastfeed. This is especially important if you are going back to work while you are still  breastfeeding or when you are not able to be present during feedings. Your lactation consultant can give you guidelines on how long it is safe to store breast milk. A breast pump is a machine that allows you to pump milk from your breast into a sterile bottle. The pumped breast milk can then be stored in a refrigerator or freezer. Some breast pumps are operated by hand, while others use electricity. Ask your lactation consultant which type will work best for you. Breast pumps can be purchased, but some hospitals and breastfeeding support groups lease breast pumps on a monthly basis. A lactation consultant can teach you how to hand express breast milk, if you prefer not to use a pump. Caring for your breasts while you breastfeed Nipples can become dry, cracked, and sore while breastfeeding. The following recommendations can help keep your breasts moisturized and healthy:  Avoid using soap on your nipples.  Wear a supportive bra. Although not required, special nursing bras and tank tops are designed to allow access to your breasts for breastfeeding without taking off your entire bra or top. Avoid wearing underwire-style bras or extremely tight bras.  Air dry your nipples for 3-4minutes after each feeding.  Use only cotton bra pads to absorb leaked breast milk. Leaking of breast milk between feedings is normal.  Use lanolin on your nipples after breastfeeding. Lanolin helps to maintain your skin's normal moisture barrier. If you use pure lanolin, you do not need to wash it off before feeding your baby again. Pure lanolin is not toxic to your baby. You may also hand express a few drops of breast milk and gently massage that milk into your nipples and allow the milk to air dry.  In the first few weeks after giving birth, some women experience extremely full breasts (engorgement). Engorgement can make your breasts feel heavy, warm, and tender to the touch. Engorgement peaks within 3-5 days after you give  birth. The following recommendations can help ease engorgement:  Completely empty your breasts while breastfeeding or pumping. You may want to start by applying warm, moist heat (in the shower or with warm water-soaked hand towels) just before feeding or pumping. This increases circulation and helps the milk flow. If your baby does not completely empty your breasts while breastfeeding, pump any extra milk after he or she is finished.  Wear a snug bra (nursing or regular) or tank top for 1-2 days to signal your body to slightly decrease milk production.  Apply ice packs to your breasts, unless this is too uncomfortable for you.  Make sure that your baby is latched on and positioned properly while breastfeeding.  If engorgement persists after 48 hours of following these recommendations, contact your health care provider or a lactation consultant. Overall health care recommendations while breastfeeding  Eat healthy foods. Alternate between meals and snacks, eating 3 of each per day. Because what you eat affects your breast milk, some of the foods may make your baby more irritable than usual. Avoid eating these foods if you are sure that they are negatively affecting your baby.  Drink milk, fruit juice, and water to satisfy your thirst (about 10 glasses a day).    Rest often, relax, and continue to take your prenatal vitamins to prevent fatigue, stress, and anemia.  Continue breast self-awareness checks.  Avoid chewing and smoking tobacco. Chemicals from cigarettes that pass into breast milk and exposure to secondhand smoke may harm your baby.  Avoid alcohol and drug use, including marijuana. Some medicines that may be harmful to your baby can pass through breast milk. It is important to ask your health care provider before taking any medicine, including all over-the-counter and prescription medicine as well as vitamin and herbal supplements. It is possible to become pregnant while breastfeeding.  If birth control is desired, ask your health care provider about options that will be safe for your baby. Contact a health care provider if:  You feel like you want to stop breastfeeding or have become frustrated with breastfeeding.  You have painful breasts or nipples.  Your nipples are cracked or bleeding.  Your breasts are red, tender, or warm.  You have a swollen area on either breast.  You have a fever or chills.  You have nausea or vomiting.  You have drainage other than breast milk from your nipples.  Your breasts do not become full before feedings by the fifth day after you give birth.  You feel sad and depressed.  Your baby is too sleepy to eat well.  Your baby is having trouble sleeping.  Your baby is wetting less than 3 diapers in a 24-hour period.  Your baby has less than 3 stools in a 24-hour period.  Your baby's skin or the white part of his or her eyes becomes yellow.  Your baby is not gaining weight by 5 days of age. Get help right away if:  Your baby is overly tired (lethargic) and does not want to wake up and feed.  Your baby develops an unexplained fever. This information is not intended to replace advice given to you by your health care provider. Make sure you discuss any questions you have with your health care provider. Document Released: 07/20/2005 Document Revised: 01/01/2016 Document Reviewed: 01/11/2013 [employer] Interactive Patient Education  2017 [employer]  

## 2016-10-19 NOTE — Progress Notes (Signed)
   PRENATAL VISIT NOTE  Subjective:  Tiffany Chavez is a 26 y.o. G2P1001 at [redacted]w[redacted]d being seen today for ongoing prenatal care.  She is currently monitored for the following issues for this low-risk pregnancy and has Supervision of normal pregnancy, antepartum and Enterococcus UTI in pregnancy, second trimester on her problem list.  Patient reports no complaints.  Contractions: Not present. Vag. Bleeding: None.  Movement: Present. Denies leaking of fluid.   The following portions of the patient's history were reviewed and updated as appropriate: allergies, current medications, past family history, past medical history, past social history, past surgical history and problem list. Problem list updated.  Objective:   Vitals:   10/19/16 1317  BP: 114/70  Pulse: 97  Weight: 145 lb (65.8 kg)    Fetal Status: Fetal Heart Rate (bpm): 154 Fundal Height: 22 cm Movement: Present     General:  Alert, oriented and cooperative. Patient is in no acute distress.  Skin: Skin is warm and dry. No rash noted.   Cardiovascular: Normal heart rate noted  Respiratory: Normal respiratory effort, no problems with respiration noted  Abdomen: Soft, gravid, appropriate for gestational age. Pain/Pressure: Absent     Pelvic:  Cervical exam deferred        Extremities: Normal range of motion.  Edema: None  Mental Status: Normal mood and affect. Normal behavior. Normal judgment and thought content.   Assessment and Plan:  Pregnancy: G2P1001 at [redacted]w[redacted]d  1. Enterococcus UTI in pregnancy, second trimester Never took her meds-rx re-sent. Still needs TOC next visit - amoxicillin (AMOXIL) 500 MG capsule; Take 1 capsule (500 mg total) by mouth 3 (three) times daily.  Dispense: 21 capsule; Refill: 2  2. Encounter for supervision of other normal pregnancy in second trimester Continue routine prenatal care. Baby Rx pt. f/u anatomy u/s scheduled  - [place] MFM OB FOLLOW UP; Future  Preterm labor symptoms and  general obstetric precautions including but not limited to vaginal bleeding, contractions, leaking of fluid and fetal movement were reviewed in detail with the patient. Please refer to After Visit Summary for other counseling recommendations.  Return in about 8 weeks (around 12/14/2016) for Return OB.   Donnamae Jude, MD

## 2016-10-19 NOTE — Progress Notes (Signed)
Pt never picked up Amoxil - will resend to pharmacy May need to change EDD according to dating guidelines - EDD by LMP 02/23/17, EDD by 2nd trimester [place] 03/05/17 - difference of 10 days.  At initial prenatal 14wk bedside US shows 02/26/17 EDD

## 2016-11-04 ENCOUNTER — Telehealth: Payer: Self-pay | Admitting: *Deleted

## 2016-11-04 NOTE — Telephone Encounter (Signed)
Informed pt that PNV had been sent to pharmacy in February with 8 refills, instructed to call the pharmacy and request a refill.

## 2016-11-04 NOTE — Telephone Encounter (Signed)
-----   Message from Blanchie Dessert, [place] sent at 11/04/2016 11:19 AM EDT ----- Regarding: Rx refill Contact: [phone] Refill for prenatal vits sent to [facility] on [address]

## 2016-11-19 ENCOUNTER — Other Ambulatory Visit: Payer: Self-pay | Admitting: Family Medicine

## 2016-11-19 ENCOUNTER — Encounter: Payer: Self-pay | Admitting: Family Medicine

## 2016-11-19 ENCOUNTER — Ambulatory Visit (HOSPITAL_COMMUNITY)
Admission: RE | Admit: 2016-11-19 | Discharge: 2016-11-19 | Disposition: A | Discharge status: Home / Self Care | Payer: Medicaid Other | Source: Ambulatory Visit | Attending: Family Medicine | Admitting: Family Medicine

## 2016-11-19 DIAGNOSIS — D271 Benign neoplasm of left ovary: Secondary | ICD-10-CM

## 2016-11-19 DIAGNOSIS — Z3A24 24 weeks gestation of pregnancy: Secondary | ICD-10-CM

## 2016-11-19 DIAGNOSIS — Z362 Encounter for other antenatal screening follow-up: Secondary | ICD-10-CM

## 2016-11-19 DIAGNOSIS — Z3482 Encounter for supervision of other normal pregnancy, second trimester: Secondary | ICD-10-CM | POA: Diagnosis present

## 2016-11-19 HISTORY — DX: Benign neoplasm of left ovary: D27.1

## 2016-12-16 ENCOUNTER — Encounter: Payer: Self-pay | Admitting: *Deleted

## 2016-12-16 ENCOUNTER — Ambulatory Visit (INDEPENDENT_AMBULATORY_CARE_PROVIDER_SITE_OTHER): Payer: Medicaid Other | Admitting: Family Medicine

## 2016-12-16 VITALS — BP 107/71 | HR 99 | Wt 155.0 lb

## 2016-12-16 DIAGNOSIS — O2342 Unspecified infection of urinary tract in pregnancy, second trimester: Secondary | ICD-10-CM

## 2016-12-16 DIAGNOSIS — O2343 Unspecified infection of urinary tract in pregnancy, third trimester: Secondary | ICD-10-CM

## 2016-12-16 DIAGNOSIS — Z3480 Encounter for supervision of other normal pregnancy, unspecified trimester: Secondary | ICD-10-CM

## 2016-12-16 DIAGNOSIS — Z3483 Encounter for supervision of other normal pregnancy, third trimester: Secondary | ICD-10-CM

## 2016-12-16 NOTE — Progress Notes (Signed)
   PRENATAL VISIT NOTE  Subjective:  Tiffany Chavez Tiffany Chavez is a 26 y.o. G2P1001 at [redacted]w[redacted]d being seen today for ongoing prenatal care.  She is currently monitored for the following issues for this low-risk pregnancy and has Supervision of normal pregnancy, antepartum; Enterococcus UTI in pregnancy, second trimester; and Dermoid cyst of ovary, left on her problem list.  Patient reports no complaints.  Contractions: Not present. Vag. Bleeding: None.  Movement: Present. Denies leaking of fluid.   The following portions of the patient's history were reviewed and updated as appropriate: allergies, current medications, past family history, past medical history, past social history, past surgical history and problem list. Problem list updated.  Objective:   Vitals:   12/16/16 0818  BP: 107/71  Pulse: 99  Weight: 155 lb (70.3 kg)    Fetal Status: Fetal Heart Rate (bpm): 136   Movement: Present     General:  Alert, oriented and cooperative. Patient is in no acute distress.  Skin: Skin is warm and dry. No rash noted.   Cardiovascular: Normal heart rate noted  Respiratory: Normal respiratory effort, no problems with respiration noted  Abdomen: Soft, gravid, appropriate for gestational age. Pain/Pressure: Absent     Pelvic:  Cervical exam deferred        Extremities: Normal range of motion.  Edema: None  Mental Status: Normal mood and affect. Normal behavior. Normal judgment and thought content.   Assessment and Plan:  Pregnancy: G2P1001 at [redacted]w[redacted]d  1. Encounter for supervision of other normal pregnancy in third trimester - Doing well today, no complaints. Discussed LARC and breastfeeding today - CBC - RPR - HIV antibody - Glucose Tolerance, 2 Hours w/1 Hour - Culture, OB Urine - Tdap vaccine greater than or equal to 7yo IM  2. Enterococcus UTI in pregnancy, second trimester - UCx today for TOC  3. Supervision of other normal pregnancy, antepartum UTD, will get TDAP at next  visit  Preterm labor symptoms and general obstetric precautions including but not limited to vaginal bleeding, contractions, leaking of fluid and fetal movement were reviewed in detail with the patient. Please refer to After Visit Summary for other counseling recommendations.  Return in about 2 weeks (around 12/30/2016) for Return OB.   Caren Macadam, MD

## 2016-12-16 NOTE — Patient Instructions (Signed)
Breastfeeding Deciding to breastfeed is one of the best choices you can make for you and your baby. A change in hormones during pregnancy causes your breast tissue to grow and increases the number and size of your milk ducts. These hormones also allow proteins, sugars, and fats from your blood supply to make breast milk in your milk-producing glands. Hormones prevent breast milk from being released before your baby is born as well as prompt milk flow after birth. Once breastfeeding has begun, thoughts of your baby, as well as his or her sucking or crying, can stimulate the release of milk from your milk-producing glands. Benefits of breastfeeding For Your Baby  Your first milk (colostrum) helps your baby's digestive system function better.  There are antibodies in your milk that help your baby fight off infections.  Your baby has a lower incidence of asthma, allergies, and sudden infant death syndrome.  The nutrients in breast milk are better for your baby than infant formulas and are designed uniquely for your baby's needs.  Breast milk improves your baby's brain development.  Your baby is less likely to develop other conditions, such as childhood obesity, asthma, or type 2 diabetes mellitus.  For You  Breastfeeding helps to create a very special bond between you and your baby.  Breastfeeding is convenient. Breast milk is always available at the correct temperature and costs nothing.  Breastfeeding helps to burn calories and helps you lose the weight gained during pregnancy.  Breastfeeding makes your uterus contract to its prepregnancy size faster and slows bleeding (lochia) after you give birth.  Breastfeeding helps to lower your risk of developing type 2 diabetes mellitus, osteoporosis, and breast or ovarian cancer later in life.  Signs that your baby is hungry Early Signs of Hunger  Increased alertness or activity.  Stretching.  Movement of the head from side to  side.  Movement of the head and opening of the mouth when the corner of the mouth or cheek is stroked (rooting).  Increased sucking sounds, smacking lips, cooing, sighing, or squeaking.  Hand-to-mouth movements.  Increased sucking of fingers or hands.  Late Signs of Hunger  Fussing.  Intermittent crying.  Extreme Signs of Hunger Signs of extreme hunger will require calming and consoling before your baby will be able to breastfeed successfully. Do not wait for the following signs of extreme hunger to occur before you initiate breastfeeding:  Restlessness.  A loud, strong cry.  Screaming.  Breastfeeding basics Breastfeeding Initiation  Find a comfortable place to sit or lie down, with your neck and back well supported.  Place a pillow or rolled up blanket under your baby to bring him or her to the level of your breast (if you are seated). Nursing pillows are specially designed to help support your arms and your baby while you breastfeed.  Make sure that your baby's abdomen is facing your abdomen.  Gently massage your breast. With your fingertips, massage from your chest wall toward your nipple in a circular motion. This encourages milk flow. You may need to continue this action during the feeding if your milk flows slowly.  Support your breast with 4 fingers underneath and your thumb above your nipple. Make sure your fingers are well away from your nipple and your baby's mouth.  Stroke your baby's lips gently with your finger or nipple.  When your baby's mouth is open wide enough, quickly bring your baby to your breast, placing your entire nipple and as much of the colored area   around your nipple (areola) as possible into your baby's mouth. ? More areola should be visible above your baby's upper lip than below the lower lip. ? Your baby's tongue should be between his or her lower gum and your breast.  Ensure that your baby's mouth is correctly positioned around your nipple  (latched). Your baby's lips should create a seal on your breast and be turned out (everted).  It is common for your baby to suck about 2-3 minutes in order to start the flow of breast milk.  Latching Teaching your baby how to latch on to your breast properly is very important. An improper latch can cause nipple pain and decreased milk supply for you and poor weight gain in your baby. Also, if your baby is not latched onto your nipple properly, he or she may swallow some air during feeding. This can make your baby fussy. Burping your baby when you switch breasts during the feeding can help to get rid of the air. However, teaching your baby to latch on properly is still the best way to prevent fussiness from swallowing air while breastfeeding. Signs that your baby has successfully latched on to your nipple:  Silent tugging or silent sucking, without causing you pain.  Swallowing heard between every 3-4 sucks.  Muscle movement above and in front of his or her ears while sucking.  Signs that your baby has not successfully latched on to nipple:  Sucking sounds or smacking sounds from your baby while breastfeeding.  Nipple pain.  If you think your baby has not latched on correctly, slip your finger into the corner of your baby's mouth to break the suction and place it between your baby's gums. Attempt breastfeeding initiation again. Signs of Successful Breastfeeding Signs from your baby:  A gradual decrease in the number of sucks or complete cessation of sucking.  Falling asleep.  Relaxation of his or her body.  Retention of a small amount of milk in his or her mouth.  Letting go of your breast by himself or herself.  Signs from you:  Breasts that have increased in firmness, weight, and size 1-3 hours after feeding.  Breasts that are softer immediately after breastfeeding.  Increased milk volume, as well as a change in milk consistency and color by the fifth day of  breastfeeding.  Nipples that are not sore, cracked, or bleeding.  Signs That Your Baby is Getting Enough Milk  Wetting at least 1-2 diapers during the first 24 hours after birth.  Wetting at least 5-6 diapers every 24 hours for the first week after birth. The urine should be clear or pale yellow by 5 days after birth.  Wetting 6-8 diapers every 24 hours as your baby continues to grow and develop.  At least 3 stools in a 24-hour period by age 5 days. The stool should be soft and yellow.  At least 3 stools in a 24-hour period by age 7 days. The stool should be seedy and yellow.  No loss of weight greater than 10% of birth weight during the first 3 days of age.  Average weight gain of 4-7 ounces (113-198 g) per week after age 4 days.  Consistent daily weight gain by age 5 days, without weight loss after the age of 2 weeks.  After a feeding, your baby may spit up a small amount. This is common. Breastfeeding frequency and duration Frequent feeding will help you make more milk and can prevent sore nipples and breast engorgement. Breastfeed when   you feel the need to reduce the fullness of your breasts or when your baby shows signs of hunger. This is called "breastfeeding on demand." Avoid introducing a pacifier to your baby while you are working to establish breastfeeding (the first 4-6 weeks after your baby is born). After this time you may choose to use a pacifier. Research has shown that pacifier use during the first year of a baby's life decreases the risk of sudden infant death syndrome (SIDS). Allow your baby to feed on each breast as long as he or she wants. Breastfeed until your baby is finished feeding. When your baby unlatches or falls asleep while feeding from the first breast, offer the second breast. Because newborns are often sleepy in the first few weeks of life, you may need to awaken your baby to get him or her to feed. Breastfeeding times will vary from baby to baby. However,  the following rules can serve as a guide to help you ensure that your baby is properly fed:  Newborns (babies 4 weeks of age or younger) may breastfeed every 1-3 hours.  Newborns should not go longer than 3 hours during the day or 5 hours during the night without breastfeeding.  You should breastfeed your baby a minimum of 8 times in a 24-hour period until you begin to introduce solid foods to your baby at around 6 months of age.  Breast milk pumping Pumping and storing breast milk allows you to ensure that your baby is exclusively fed your breast milk, even at times when you are unable to breastfeed. This is especially important if you are going back to work while you are still breastfeeding or when you are not able to be present during feedings. Your lactation consultant can give you guidelines on how long it is safe to store breast milk. A breast pump is a machine that allows you to pump milk from your breast into a sterile bottle. The pumped breast milk can then be stored in a refrigerator or freezer. Some breast pumps are operated by hand, while others use electricity. Ask your lactation consultant which type will work best for you. Breast pumps can be purchased, but some hospitals and breastfeeding support groups lease breast pumps on a monthly basis. A lactation consultant can teach you how to hand express breast milk, if you prefer not to use a pump. Caring for your breasts while you breastfeed Nipples can become dry, cracked, and sore while breastfeeding. The following recommendations can help keep your breasts moisturized and healthy:  Avoid using soap on your nipples.  Wear a supportive bra. Although not required, special nursing bras and tank tops are designed to allow access to your breasts for breastfeeding without taking off your entire bra or top. Avoid wearing underwire-style bras or extremely tight bras.  Air dry your nipples for 3-4minutes after each feeding.  Use only cotton  bra pads to absorb leaked breast milk. Leaking of breast milk between feedings is normal.  Use lanolin on your nipples after breastfeeding. Lanolin helps to maintain your skin's normal moisture barrier. If you use pure lanolin, you do not need to wash it off before feeding your baby again. Pure lanolin is not toxic to your baby. You may also hand express a few drops of breast milk and gently massage that milk into your nipples and allow the milk to air dry.  In the first few weeks after giving birth, some women experience extremely full breasts (engorgement). Engorgement can make your   Engorgement peaks within 3-5 days after you give birth. The following recommendations can help ease engorgement:  Completely empty your breasts while breastfeeding or pumping. You may want to start by applying warm, moist heat (in the shower or with warm water-soaked hand towels) just before feeding or pumping. This increases circulation and helps the milk flow. If your baby does not completely empty your breasts while breastfeeding, pump any extra milk after he or she is finished.  Wear a snug bra (nursing or regular) or tank top for 1-2 days to signal your body to slightly decrease milk production.  Apply ice packs to your breasts, unless this is too uncomfortable for you.  Make sure that your baby is latched on and positioned properly while breastfeeding. If engorgement persists after 48 hours of following these recommendations, contact your health care provider or a Science writer. Overall health care recommendations while breastfeeding  Eat healthy foods. Alternate between meals and snacks, eating 3 of each per day. Because what you eat affects your breast milk, some of the foods may make your baby more irritable than usual. Avoid eating these foods if you are sure that they are negatively affecting your baby.  Drink milk, fruit juice, and water to satisfy your thirst (about 10 glasses a day).  Rest often,  relax, and continue to take your prenatal vitamins to prevent fatigue, stress, and anemia.  Continue breast self-awareness checks.  Avoid chewing and smoking tobacco. Chemicals from cigarettes that pass into breast milk and exposure to secondhand smoke may harm your baby.  Avoid alcohol and drug use, including marijuana. Some medicines that may be harmful to your baby can pass through breast milk. It is important to ask your health care provider before taking any medicine, including all over-the-counter and prescription medicine as well as vitamin and herbal supplements. It is possible to become pregnant while breastfeeding. If birth control is desired, ask your health care provider about options that will be safe for your baby. Contact a health care provider if:  You feel like you want to stop breastfeeding or have become frustrated with breastfeeding.  You have painful breasts or nipples.  Your nipples are cracked or bleeding.  Your breasts are red, tender, or warm.  You have a swollen area on either breast.  You have a fever or chills.  You have nausea or vomiting.  You have drainage other than breast milk from your nipples.  Your breasts do not become full before feedings by the fifth day after you give birth.  You feel sad and depressed.  Your baby is too sleepy to eat well.  Your baby is having trouble sleeping.  Your baby is wetting less than 3 diapers in a 24-hour period.  Your baby has less than 3 stools in a 24-hour period.  Your baby's skin or the white part of his or her eyes becomes yellow.  Your baby is not gaining weight by 18 days of age. Get help right away if:  Your baby is overly tired (lethargic) and does not want to wake up and feed.  Your baby develops an unexplained fever. This information is not intended to replace advice given to you by your health care provider. Make sure you discuss any questions you have with your health care  provider. Document Released: 07/20/2005 Document Revised: 01/01/2016 Document Reviewed: 01/11/2013 Elsevier Interactive Patient Education  2017 [employer]. How a Baby Grows During Pregnancy Pregnancy begins when a female's sperm enters a female's egg (fertilization).  This happens in one of the tubes (fallopian tubes) that connect the ovaries to the womb (uterus). The fertilized egg is called an embryo until it reaches 10 weeks. From 10 weeks until birth, it is called a fetus. The fertilized egg moves down the fallopian tube to the uterus. Then it implants into the lining of the uterus and begins to grow. The developing fetus receives oxygen and nutrients through the pregnant woman's bloodstream and the tissues that grow (placenta) to support the fetus. The placenta is the life support system for the fetus. It provides nutrition and removes waste. Learning as much as you can about your pregnancy and how your baby is developing can help you enjoy the experience. It can also make you aware of when there might be a problem and when to ask questions. How long does a typical pregnancy last? A pregnancy usually lasts 280 days, or about 40 weeks. Pregnancy is divided into three trimesters:  First trimester: 0-13 weeks.  Second trimester: 14-27 weeks.  Third trimester: 28-40 weeks. The day when your baby is considered ready to be born (full term) is your estimated date of delivery. How does my baby develop month by month? First month  The fertilized egg attaches to the inside of the uterus.  Some cells will form the placenta. Others will form the fetus.  The arms, legs, brain, spinal cord, lungs, and heart begin to develop.  At the end of the first month, the heart begins to beat. Second month  The bones, inner ear, eyelids, hands, and feet form.  The genitals develop.  By the end of 8 weeks, all major organs are developing. Third month  All of the internal organs are forming.  Teeth  develop below the gums.  Bones and muscles begin to grow. The spine can flex.  The skin is transparent.  Fingernails and toenails begin to form.  Arms and legs continue to grow longer, and hands and feet develop.  The fetus is about 3 in (7.6 cm) long. Fourth month  The placenta is completely formed.  The external sex organs, neck, outer ear, eyebrows, eyelids, and fingernails are formed.  The fetus can hear, swallow, and move its arms and legs.  The kidneys begin to produce urine.  The skin is covered with a white waxy coating (vernix) and very fine hair (lanugo). Fifth month  The fetus moves around more and can be felt for the first time (quickening).  The fetus starts to sleep and wake up and may begin to suck its finger.  The nails grow to the end of the fingers.  The organ in the digestive system that makes bile (gallbladder) functions and helps to digest the nutrients.  If your baby is a girl, eggs are present in her ovaries. If your baby is a boy, testicles start to move down into his scrotum. Sixth month  The lungs are formed, but the fetus is not yet able to breathe.  The eyes open. The brain continues to develop.  Your baby has fingerprints and toe prints. Your baby's hair grows thicker.  At the end of the second trimester, the fetus is about 9 in (22.9 cm) long. Seventh month  The fetus kicks and stretches.  The eyes are developed enough to sense changes in light.  The hands can make a grasping motion.  The fetus responds to sound. Eighth month  All organs and body systems are fully developed and functioning.  Bones harden and taste buds develop.  The fetus may hiccup.  Certain areas of the brain are still developing. The skull remains soft. Ninth month  The fetus gains about  lb (0.23 kg) each week.  The lungs are fully developed.  Patterns of sleep develop.  The fetus's head typically moves into a head-down position (vertex) in the uterus  to prepare for birth. If the buttocks move into a vertex position instead, the baby is breech.  The fetus weighs 6-9 lbs (2.72-4.08 kg) and is 19-20 in (48.26-50.8 cm) long. What can I do to have a healthy pregnancy and help my baby develop?  Eating and Drinking  Eat a healthy diet.  Talk with your health care provider to make sure that you are getting the nutrients that you and your baby need.  Visit www.BuildDNA.es to learn about creating a healthy diet.  Gain a healthy amount of weight during pregnancy as advised by your health care provider. This is usually 25-35 pounds. You may need to:  Gain more if you were underweight before getting pregnant or if you are pregnant with more than one baby.  Gain less if you were overweight or obese when you got pregnant. Medicines and Vitamins  Take prenatal vitamins as directed by your health care provider. These include vitamins such as folic acid, iron, calcium, and vitamin D. They are important for healthy development.  Take medicines only as directed by your health care provider. Read labels and ask a pharmacist or your health care provider whether over-the-counter medicines, supplements, and prescription drugs are safe to take during pregnancy. Activities  Be physically active as advised by your health care provider. Ask your health care provider to recommend activities that are safe for you to do, such as walking or swimming.  Do not participate in strenuous or extreme sports. Lifestyle  Do not drink alcohol.  Do not use any tobacco products, including cigarettes, chewing tobacco, or electronic cigarettes. If you need help quitting, ask your health care provider.  Do not use illegal drugs. Safety  Avoid exposure to mercury, lead, or other heavy metals. Ask your health care provider about common sources of these heavy metals.  Avoid listeria infection during pregnancy. Follow these precautions:  Do not eat soft cheeses or  deli meats.  Do not eat hot dogs unless they have been warmed up to the point of steaming, such as in the microwave oven.  Do not drink unpasteurized milk.  Avoid toxoplasmosis infection during pregnancy. Follow these precautions:  Do not change your cat's litter box, if you have a cat. Ask someone else to do this for you.  Wear gardening gloves while working in the yard. General Instructions  Keep all follow-up visits as directed by your health care provider. This is important. This includes prenatal care and screening tests.  Manage any chronic health conditions. Work closely with your health care provider to keep conditions, such as diabetes, under control. How do I know if my baby is developing well? At each prenatal visit, your health care provider will do several different tests to check on your health and keep track of your baby's development. These include:  Fundal height.  Your health care provider will measure your growing belly from top to bottom using a tape measure.  Your health care provider will also feel your belly to determine your baby's position.  Heartbeat.  An ultrasound in the first trimester can confirm pregnancy and show a heartbeat, depending on how far along you are.  Your health care  provider will check your baby's heart rate at every prenatal visit.  As you get closer to your delivery date, you may have regular fetal heart rate monitoring to make sure that your baby is not in distress.  Second trimester ultrasound.  This ultrasound checks your baby's development. It also indicates your baby's gender. What should I do if I have concerns about my baby's development? Always talk with your health care provider about any concerns that you may have. This information is not intended to replace advice given to you by your health care provider. Make sure you discuss any questions you have with your health care provider. Document Released: 01/06/2008 Document  Revised: 12/26/2015 Document Reviewed: 12/27/2013 Elsevier Interactive Patient Education  2017 [employer].

## 2016-12-18 LAB — HIV ANTIBODY (ROUTINE TESTING W REFLEX): HIV Screen 4th Generation wRfx: NONREACTIVE

## 2016-12-18 LAB — CBC
Hematocrit: 31.5 % — ABNORMAL LOW (ref 34.0–46.6)
Hemoglobin: 10.4 g/dL — ABNORMAL LOW (ref 11.1–15.9)
MCH: 27.5 pg (ref 26.6–33.0)
MCHC: 33 g/dL (ref 31.5–35.7)
MCV: 83 fL (ref 79–97)
PLATELETS: 268 10*3/uL (ref 150–379)
RBC: 3.78 x10E6/uL (ref 3.77–5.28)
RDW: 13.8 % (ref 12.3–15.4)
WBC: 10.2 10*3/uL (ref 3.4–10.8)

## 2016-12-18 LAB — URINE CULTURE, OB REFLEX: ORGANISM ID, BACTERIA: NO GROWTH

## 2016-12-18 LAB — GLUCOSE TOLERANCE, 2 HOURS W/ 1HR
Glucose, 1 hour: 134 mg/dL (ref 65–179)
Glucose, 2 hour: 115 mg/dL (ref 65–152)
Glucose, Fasting: 80 mg/dL (ref 65–91)

## 2016-12-18 LAB — RPR: RPR Ser Ql: NONREACTIVE

## 2016-12-18 LAB — CULTURE, OB URINE

## 2016-12-21 ENCOUNTER — Encounter: Payer: Self-pay | Admitting: *Deleted

## 2016-12-30 ENCOUNTER — Ambulatory Visit (INDEPENDENT_AMBULATORY_CARE_PROVIDER_SITE_OTHER): Payer: Medicaid Other | Admitting: Family Medicine

## 2016-12-30 VITALS — BP 110/67 | HR 97 | Wt 155.0 lb

## 2016-12-30 DIAGNOSIS — Z23 Encounter for immunization: Secondary | ICD-10-CM

## 2016-12-30 DIAGNOSIS — Z3483 Encounter for supervision of other normal pregnancy, third trimester: Secondary | ICD-10-CM

## 2016-12-30 DIAGNOSIS — Z3480 Encounter for supervision of other normal pregnancy, unspecified trimester: Secondary | ICD-10-CM

## 2016-12-30 NOTE — Patient Instructions (Signed)
Breastfeeding Deciding to breastfeed is one of the best choices you can make for you and your baby. A change in hormones during pregnancy causes your breast tissue to grow and increases the number and size of your milk ducts. These hormones also allow proteins, sugars, and fats from your blood supply to make breast milk in your milk-producing glands. Hormones prevent breast milk from being released before your baby is born as well as prompt milk flow after birth. Once breastfeeding has begun, thoughts of your baby, as well as his or her sucking or crying, can stimulate the release of milk from your milk-producing glands. Benefits of breastfeeding For Your Baby  Your first milk (colostrum) helps your baby's digestive system function better.  There are antibodies in your milk that help your baby fight off infections.  Your baby has a lower incidence of asthma, allergies, and sudden infant death syndrome.  The nutrients in breast milk are better for your baby than infant formulas and are designed uniquely for your baby's needs.  Breast milk improves your baby's brain development.  Your baby is less likely to develop other conditions, such as childhood obesity, asthma, or type 2 diabetes mellitus.  For You  Breastfeeding helps to create a very special bond between you and your baby.  Breastfeeding is convenient. Breast milk is always available at the correct temperature and costs nothing.  Breastfeeding helps to burn calories and helps you lose the weight gained during pregnancy.  Breastfeeding makes your uterus contract to its prepregnancy size faster and slows bleeding (lochia) after you give birth.  Breastfeeding helps to lower your risk of developing type 2 diabetes mellitus, osteoporosis, and breast or ovarian cancer later in life.  Signs that your baby is hungry Early Signs of Hunger  Increased alertness or activity.  Stretching.  Movement of the head from side to  side.  Movement of the head and opening of the mouth when the corner of the mouth or cheek is stroked (rooting).  Increased sucking sounds, smacking lips, cooing, sighing, or squeaking.  Hand-to-mouth movements.  Increased sucking of fingers or hands.  Late Signs of Hunger  Fussing.  Intermittent crying.  Extreme Signs of Hunger Signs of extreme hunger will require calming and consoling before your baby will be able to breastfeed successfully. Do not wait for the following signs of extreme hunger to occur before you initiate breastfeeding:  Restlessness.  A loud, strong cry.  Screaming.  Breastfeeding basics Breastfeeding Initiation  Find a comfortable place to sit or lie down, with your neck and back well supported.  Place a pillow or rolled up blanket under your baby to bring him or her to the level of your breast (if you are seated). Nursing pillows are specially designed to help support your arms and your baby while you breastfeed.  Make sure that your baby's abdomen is facing your abdomen.  Gently massage your breast. With your fingertips, massage from your chest wall toward your nipple in a circular motion. This encourages milk flow. You may need to continue this action during the feeding if your milk flows slowly.  Support your breast with 4 fingers underneath and your thumb above your nipple. Make sure your fingers are well away from your nipple and your baby's mouth.  Stroke your baby's lips gently with your finger or nipple.  When your baby's mouth is open wide enough, quickly bring your baby to your breast, placing your entire nipple and as much of the colored area   around your nipple (areola) as possible into your baby's mouth. ? More areola should be visible above your baby's upper lip than below the lower lip. ? Your baby's tongue should be between his or her lower gum and your breast.  Ensure that your baby's mouth is correctly positioned around your nipple  (latched). Your baby's lips should create a seal on your breast and be turned out (everted).  It is common for your baby to suck about 2-3 minutes in order to start the flow of breast milk.  Latching Teaching your baby how to latch on to your breast properly is very important. An improper latch can cause nipple pain and decreased milk supply for you and poor weight gain in your baby. Also, if your baby is not latched onto your nipple properly, he or she may swallow some air during feeding. This can make your baby fussy. Burping your baby when you switch breasts during the feeding can help to get rid of the air. However, teaching your baby to latch on properly is still the best way to prevent fussiness from swallowing air while breastfeeding. Signs that your baby has successfully latched on to your nipple:  Silent tugging or silent sucking, without causing you pain.  Swallowing heard between every 3-4 sucks.  Muscle movement above and in front of his or her ears while sucking.  Signs that your baby has not successfully latched on to nipple:  Sucking sounds or smacking sounds from your baby while breastfeeding.  Nipple pain.  If you think your baby has not latched on correctly, slip your finger into the corner of your baby's mouth to break the suction and place it between your baby's gums. Attempt breastfeeding initiation again. Signs of Successful Breastfeeding Signs from your baby:  A gradual decrease in the number of sucks or complete cessation of sucking.  Falling asleep.  Relaxation of his or her body.  Retention of a small amount of milk in his or her mouth.  Letting go of your breast by himself or herself.  Signs from you:  Breasts that have increased in firmness, weight, and size 1-3 hours after feeding.  Breasts that are softer immediately after breastfeeding.  Increased milk volume, as well as a change in milk consistency and color by the fifth day of  breastfeeding.  Nipples that are not sore, cracked, or bleeding.  Signs That Your Baby is Getting Enough Milk  Wetting at least 1-2 diapers during the first 24 hours after birth.  Wetting at least 5-6 diapers every 24 hours for the first week after birth. The urine should be clear or pale yellow by 5 days after birth.  Wetting 6-8 diapers every 24 hours as your baby continues to grow and develop.  At least 3 stools in a 24-hour period by age 5 days. The stool should be soft and yellow.  At least 3 stools in a 24-hour period by age 7 days. The stool should be seedy and yellow.  No loss of weight greater than 10% of birth weight during the first 3 days of age.  Average weight gain of 4-7 ounces (113-198 g) per week after age 4 days.  Consistent daily weight gain by age 5 days, without weight loss after the age of 2 weeks.  After a feeding, your baby may spit up a small amount. This is common. Breastfeeding frequency and duration Frequent feeding will help you make more milk and can prevent sore nipples and breast engorgement. Breastfeed when   you feel the need to reduce the fullness of your breasts or when your baby shows signs of hunger. This is called "breastfeeding on demand." Avoid introducing a pacifier to your baby while you are working to establish breastfeeding (the first 4-6 weeks after your baby is born). After this time you may choose to use a pacifier. Research has shown that pacifier use during the first year of a baby's life decreases the risk of sudden infant death syndrome (SIDS). Allow your baby to feed on each breast as long as he or she wants. Breastfeed until your baby is finished feeding. When your baby unlatches or falls asleep while feeding from the first breast, offer the second breast. Because newborns are often sleepy in the first few weeks of life, you may need to awaken your baby to get him or her to feed. Breastfeeding times will vary from baby to baby. However,  the following rules can serve as a guide to help you ensure that your baby is properly fed:  Newborns (babies 4 weeks of age or younger) may breastfeed every 1-3 hours.  Newborns should not go longer than 3 hours during the day or 5 hours during the night without breastfeeding.  You should breastfeed your baby a minimum of 8 times in a 24-hour period until you begin to introduce solid foods to your baby at around 6 months of age.  Breast milk pumping Pumping and storing breast milk allows you to ensure that your baby is exclusively fed your breast milk, even at times when you are unable to breastfeed. This is especially important if you are going back to work while you are still breastfeeding or when you are not able to be present during feedings. Your lactation consultant can give you guidelines on how long it is safe to store breast milk. A breast pump is a machine that allows you to pump milk from your breast into a sterile bottle. The pumped breast milk can then be stored in a refrigerator or freezer. Some breast pumps are operated by hand, while others use electricity. Ask your lactation consultant which type will work best for you. Breast pumps can be purchased, but some hospitals and breastfeeding support groups lease breast pumps on a monthly basis. A lactation consultant can teach you how to hand express breast milk, if you prefer not to use a pump. Caring for your breasts while you breastfeed Nipples can become dry, cracked, and sore while breastfeeding. The following recommendations can help keep your breasts moisturized and healthy:  Avoid using soap on your nipples.  Wear a supportive bra. Although not required, special nursing bras and tank tops are designed to allow access to your breasts for breastfeeding without taking off your entire bra or top. Avoid wearing underwire-style bras or extremely tight bras.  Air dry your nipples for 3-4minutes after each feeding.  Use only cotton  bra pads to absorb leaked breast milk. Leaking of breast milk between feedings is normal.  Use lanolin on your nipples after breastfeeding. Lanolin helps to maintain your skin's normal moisture barrier. If you use pure lanolin, you do not need to wash it off before feeding your baby again. Pure lanolin is not toxic to your baby. You may also hand express a few drops of breast milk and gently massage that milk into your nipples and allow the milk to air dry.  In the first few weeks after giving birth, some women experience extremely full breasts (engorgement). Engorgement can make your   Engorgement peaks within 3-5 days after you give birth. The following recommendations can help ease engorgement:  Completely empty your breasts while breastfeeding or pumping. You may want to start by applying warm, moist heat (in the shower or with warm water-soaked hand towels) just before feeding or pumping. This increases circulation and helps the milk flow. If your baby does not completely empty your breasts while breastfeeding, pump any extra milk after he or she is finished.  Wear a snug bra (nursing or regular) or tank top for 1-2 days to signal your body to slightly decrease milk production.  Apply ice packs to your breasts, unless this is too uncomfortable for you.  Make sure that your baby is latched on and positioned properly while breastfeeding. If engorgement persists after 48 hours of following these recommendations, contact your health care provider or a Science writer. Overall health care recommendations while breastfeeding  Eat healthy foods. Alternate between meals and snacks, eating 3 of each per day. Because what you eat affects your breast milk, some of the foods may make your baby more irritable than usual. Avoid eating these foods if you are sure that they are negatively affecting your baby.  Drink milk, fruit juice, and water to satisfy your thirst (about 10 glasses a day).  Rest often,  relax, and continue to take your prenatal vitamins to prevent fatigue, stress, and anemia.  Continue breast self-awareness checks.  Avoid chewing and smoking tobacco. Chemicals from cigarettes that pass into breast milk and exposure to secondhand smoke may harm your baby.  Avoid alcohol and drug use, including marijuana. Some medicines that may be harmful to your baby can pass through breast milk. It is important to ask your health care provider before taking any medicine, including all over-the-counter and prescription medicine as well as vitamin and herbal supplements. It is possible to become pregnant while breastfeeding. If birth control is desired, ask your health care provider about options that will be safe for your baby. Contact a health care provider if:  You feel like you want to stop breastfeeding or have become frustrated with breastfeeding.  You have painful breasts or nipples.  Your nipples are cracked or bleeding.  Your breasts are red, tender, or warm.  You have a swollen area on either breast.  You have a fever or chills.  You have nausea or vomiting.  You have drainage other than breast milk from your nipples.  Your breasts do not become full before feedings by the fifth day after you give birth.  You feel sad and depressed.  Your baby is too sleepy to eat well.  Your baby is having trouble sleeping.  Your baby is wetting less than 3 diapers in a 24-hour period.  Your baby has less than 3 stools in a 24-hour period.  Your baby's skin or the white part of his or her eyes becomes yellow.  Your baby is not gaining weight by 33 days of age. Get help right away if:  Your baby is overly tired (lethargic) and does not want to wake up and feed.  Your baby develops an unexplained fever. This information is not intended to replace advice given to you by your health care provider. Make sure you discuss any questions you have with your health care  provider. Document Released: 07/20/2005 Document Revised: 01/01/2016 Document Reviewed: 01/11/2013 [employer] Interactive Patient Education  2017 [place] of Pregnancy The third trimester is from week 28 through week 40 (months 7 through  9). The third trimester is a time when the unborn baby (fetus) is growing rapidly. At the end of the ninth month, the fetus is about 20 inches in length and weighs 6-10 pounds. Body changes during your third trimester Your body will continue to go through many changes during pregnancy. The changes vary from woman to woman. During the third trimester:  Your weight will continue to increase. You can expect to gain 25-35 pounds (11-16 kg) by the end of the pregnancy.  You may begin to get stretch marks on your hips, abdomen, and breasts.  You may urinate more often because the fetus is moving lower into your pelvis and pressing on your bladder.  You may develop or continue to have heartburn. This is caused by increased hormones that slow down muscles in the digestive tract.  You may develop or continue to have constipation because increased hormones slow digestion and cause the muscles that push waste through your intestines to relax.  You may develop hemorrhoids. These are swollen veins (varicose veins) in the rectum that can itch or be painful.  You may develop swollen, bulging veins (varicose veins) in your legs.  You may have increased body aches in the pelvis, back, or thighs. This is due to weight gain and increased hormones that are relaxing your joints.  You may have changes in your hair. These can include thickening of your hair, rapid growth, and changes in texture. Some women also have hair loss during or after pregnancy, or hair that feels dry or thin. Your hair will most likely return to normal after your baby is born.  Your breasts will continue to grow and they will continue to become tender. A yellow fluid (colostrum) may  leak from your breasts. This is the first milk you are producing for your baby.  Your belly button may stick out.  You may notice more swelling in your hands, face, or ankles.  You may have increased tingling or numbness in your hands, arms, and legs. The skin on your belly may also feel numb.  You may feel short of breath because of your expanding uterus.  You may have more problems sleeping. This can be caused by the size of your belly, increased need to urinate, and an increase in your body's metabolism.  You may notice the fetus "dropping," or moving lower in your abdomen (lightening).  You may have increased vaginal discharge.  You may notice your joints feel loose and you may have pain around your pelvic bone. What to expect at prenatal visits You will have prenatal exams every 2 weeks until week 36. Then you will have weekly prenatal exams. During a routine prenatal visit:  You will be weighed to make sure you and the baby are growing normally.  Your blood pressure will be taken.  Your abdomen will be measured to track your baby's growth.  The fetal heartbeat will be listened to.  Any test results from the previous visit will be discussed.  You may have a cervical check near your due date to see if your cervix has softened or thinned (effaced).  You will be tested for Group B streptococcus. This happens between 35 and 37 weeks. Your health care provider may ask you:  What your birth plan is.  How you are feeling.  If you are feeling the baby move.  If you have had any abnormal symptoms, such as leaking fluid, bleeding, severe headaches, or abdominal cramping.  If you are using any  tobacco products, including cigarettes, chewing tobacco, and electronic cigarettes.  If you have any questions. Other tests or screenings that may be performed during your third trimester include:  Blood tests that check for low iron levels (anemia).  Fetal testing to check the  health, activity level, and growth of the fetus. Testing is done if you have certain medical conditions or if there are problems during the pregnancy.  Nonstress test (NST). This test checks the health of your baby to make sure there are no signs of problems, such as the baby not getting enough oxygen. During this test, a belt is placed around your belly. The baby is made to move, and its heart rate is monitored during movement. What is false labor? False labor is a condition in which you feel small, irregular tightenings of the muscles in the womb (contractions) that usually go away with rest, changing position, or drinking water. These are called Braxton Hicks contractions. Contractions may last for hours, days, or even weeks before true labor sets in. If contractions come at regular intervals, become more frequent, increase in intensity, or become painful, you should see your health care provider. What are the signs of labor?  Abdominal cramps.  Regular contractions that start at 10 minutes apart and become stronger and more frequent with time.  Contractions that start on the top of the uterus and spread down to the lower abdomen and back.  Increased pelvic pressure and dull back pain.  A watery or bloody mucus discharge that comes from the vagina.  Leaking of amniotic fluid. This is also known as your "water breaking." It could be a slow trickle or a gush. Let your health care provider know if it has a color or strange odor. If you have any of these signs, call your health care provider right away, even if it is before your due date. Follow these instructions at home: Medicines   Follow your health care provider's instructions regarding medicine use. Specific medicines may be either safe or unsafe to take during pregnancy.  Take a prenatal vitamin that contains at least 600 micrograms (mcg) of folic acid.  If you develop constipation, try taking a stool softener if your health care  provider approves. Eating and drinking   Eat a balanced diet that includes fresh fruits and vegetables, whole grains, good sources of protein such as meat, eggs, or tofu, and low-fat dairy. Your health care provider will help you determine the amount of weight gain that is right for you.  Avoid raw meat and uncooked cheese. These carry germs that can cause birth defects in the baby.  If you have low calcium intake from food, talk to your health care provider about whether you should take a daily calcium supplement.  Eat four or five small meals rather than three large meals a day.  Limit foods that are high in fat and processed sugars, such as fried and sweet foods.  To prevent constipation:  Drink enough fluid to keep your urine clear or pale yellow.  Eat foods that are high in fiber, such as fresh fruits and vegetables, whole grains, and beans. Activity   Exercise only as directed by your health care provider. Most women can continue their usual exercise routine during pregnancy. Try to exercise for 30 minutes at least 5 days a week. Stop exercising if you experience uterine contractions.  Avoid heavy lifting.  Do not exercise in extreme heat or humidity, or at high altitudes.  Wear low-heel, comfortable  shoes.  Practice good posture.  You may continue to have sex unless your health care provider tells you otherwise. Relieving pain and discomfort   Take frequent breaks and rest with your legs elevated if you have leg cramps or low back pain.  Take warm sitz baths to soothe any pain or discomfort caused by hemorrhoids. Use hemorrhoid cream if your health care provider approves.  Wear a good support bra to prevent discomfort from breast tenderness.  If you develop varicose veins:  Wear support pantyhose or compression stockings as told by your healthcare provider.  Elevate your feet for 15 minutes, 3-4 times a day. Prenatal care   Write down your questions. Take them to  your prenatal visits.  Keep all your prenatal visits as told by your health care provider. This is important. Safety   Wear your seat belt at all times when driving.  Make a list of emergency phone numbers, including numbers for family, friends, the hospital, and police and fire departments. General instructions   Avoid cat litter boxes and soil used by cats. These carry germs that can cause birth defects in the baby. If you have a cat, ask someone to clean the litter box for you.  Do not travel far distances unless it is absolutely necessary and only with the approval of your health care provider.  Do not use hot tubs, steam rooms, or saunas.  Do not drink alcohol.  Do not use any products that contain nicotine or tobacco, such as cigarettes and e-cigarettes. If you need help quitting, ask your health care provider.  Do not use any medicinal herbs or unprescribed drugs. These chemicals affect the formation and growth of the baby.  Do not douche or use tampons or scented sanitary pads.  Do not cross your legs for long periods of time.  To prepare for the arrival of your baby:  Take prenatal classes to understand, practice, and ask questions about labor and delivery.  Make a trial run to the hospital.  Visit the hospital and tour the maternity area.  Arrange for maternity or paternity leave through employers.  Arrange for family and friends to take care of pets while you are in the hospital.  Purchase a rear-facing car seat and make sure you know how to install it in your car.  Pack your hospital bag.  Prepare the baby's nursery. Make sure to remove all pillows and stuffed animals from the baby's crib to prevent suffocation.  Visit your dentist if you have not gone during your pregnancy. Use a soft toothbrush to brush your teeth and be gentle when you floss. Contact a health care provider if:  You are unsure if you are in labor or if your water has broken.  You become  dizzy.  You have mild pelvic cramps, pelvic pressure, or nagging pain in your abdominal area.  You have lower back pain.  You have persistent nausea, vomiting, or diarrhea.  You have an unusual or bad smelling vaginal discharge.  You have pain when you urinate. Get help right away if:  Your water breaks before 37 weeks.  You have regular contractions less than 5 minutes apart before 37 weeks.  You have a fever.  You are leaking fluid from your vagina.  You have spotting or bleeding from your vagina.  You have severe abdominal pain or cramping.  You have rapid weight loss or weight gain.  You have shortness of breath with chest pain.  You notice sudden or  extreme swelling of your face, hands, ankles, feet, or legs.  Your baby makes fewer than 10 movements in 2 hours.  You have severe headaches that do not go away when you take medicine.  You have vision changes. Summary  The third trimester is from week 28 through week 40, months 7 through 9. The third trimester is a time when the unborn baby (fetus) is growing rapidly.  During the third trimester, your discomfort may increase as you and your baby continue to gain weight. You may have abdominal, leg, and back pain, sleeping problems, and an increased need to urinate.  During the third trimester your breasts will keep growing and they will continue to become tender. A yellow fluid (colostrum) may leak from your breasts. This is the first milk you are producing for your baby.  False labor is a condition in which you feel small, irregular tightenings of the muscles in the womb (contractions) that eventually go away. These are called Braxton Hicks contractions. Contractions may last for hours, days, or even weeks before true labor sets in.  Signs of labor can include: abdominal cramps; regular contractions that start at 10 minutes apart and become stronger and more frequent with time; watery or bloody mucus discharge that  comes from the vagina; increased pelvic pressure and dull back pain; and leaking of amniotic fluid. This information is not intended to replace advice given to you by your health care provider. Make sure you discuss any questions you have with your health care provider. Document Released: 07/14/2001 Document Revised: 12/26/2015 Document Reviewed: 09/20/2012 Elsevier Interactive Patient Education  2017 [employer].

## 2016-12-30 NOTE — Progress Notes (Signed)
   PRENATAL VISIT NOTE  Subjective:  Kiaraliz A Covarrubias [name] is a 26 y.o. G2P1001 at [redacted]w[redacted]d being seen today for ongoing prenatal care.  She is currently monitored for the following issues for this low-risk pregnancy and has Supervision of normal pregnancy, antepartum; Enterococcus UTI in pregnancy, second trimester; and Dermoid cyst of ovary, left on her problem list.  Patient reports no complaints.  Contractions: Not present. Vag. Bleeding: None.  Movement: Present. Denies leaking of fluid.   The following portions of the patient's history were reviewed and updated as appropriate: allergies, current medications, past family history, past medical history, past social history, past surgical history and problem list. Problem list updated.  Objective:   Vitals:   12/30/16 0900  BP: 110/67  Pulse: 97  Weight: 155 lb (70.3 kg)    Fetal Status:   Fundal Height: 32 cm Movement: Present     General:  Alert, oriented and cooperative. Patient is in no acute distress.  Skin: Skin is warm and dry. No rash noted.   Cardiovascular: Normal heart rate noted  Respiratory: Normal respiratory effort, no problems with respiration noted  Abdomen: Soft, gravid, appropriate for gestational age. Pain/Pressure: Absent     Pelvic:  Cervical exam deferred        Extremities: Normal range of motion.  Edema: None  Mental Status: Normal mood and affect. Normal behavior. Normal judgment and thought content.   Assessment and Plan:  Pregnancy: G2P1001 at [redacted]w[redacted]d  1. Supervision of other normal pregnancy, antepartum Doing well, no concerns from patient. Has her shower today Reviewed 2hr gtt results which were wnl Ucx was negative last visit  Reviewed labor precautions   Preterm labor symptoms and general obstetric precautions including but not limited to vaginal bleeding, contractions, leaking of fluid and fetal movement were reviewed in detail with the patient. Please refer to After Visit Summary for  other counseling recommendations.  Return in about 4 weeks (around 01/27/2017) for Routine prenatal care.   Caren Macadam, MD\

## 2017-01-27 ENCOUNTER — Other Ambulatory Visit (HOSPITAL_COMMUNITY)
Admission: RE | Admit: 2017-01-27 | Discharge: 2017-01-27 | Disposition: A | Discharge status: Home / Self Care | Payer: Medicaid Other | Source: Ambulatory Visit | Attending: Family Medicine | Admitting: Family Medicine

## 2017-01-27 ENCOUNTER — Ambulatory Visit (INDEPENDENT_AMBULATORY_CARE_PROVIDER_SITE_OTHER): Payer: Medicaid Other | Admitting: Family Medicine

## 2017-01-27 VITALS — BP 113/77 | HR 72 | Wt 159.0 lb

## 2017-01-27 DIAGNOSIS — O2343 Unspecified infection of urinary tract in pregnancy, third trimester: Secondary | ICD-10-CM

## 2017-01-27 DIAGNOSIS — O2342 Unspecified infection of urinary tract in pregnancy, second trimester: Secondary | ICD-10-CM

## 2017-01-27 DIAGNOSIS — Z3483 Encounter for supervision of other normal pregnancy, third trimester: Secondary | ICD-10-CM | POA: Insufficient documentation

## 2017-01-27 DIAGNOSIS — Z3480 Encounter for supervision of other normal pregnancy, unspecified trimester: Secondary | ICD-10-CM

## 2017-01-27 DIAGNOSIS — Z3A36 36 weeks gestation of pregnancy: Secondary | ICD-10-CM | POA: Diagnosis not present

## 2017-01-27 LAB — OB RESULTS CONSOLE GBS: GBS: NEGATIVE

## 2017-01-27 NOTE — Progress Notes (Signed)
   PRENATAL VISIT NOTE  Subjective:  Tiffany Chavez is a 26 y.o. G2P1001 at [redacted]w[redacted]d being seen today for ongoing prenatal care.  She is currently monitored for the following issues for this low-risk pregnancy and has Supervision of normal pregnancy, antepartum; Enterococcus UTI in pregnancy, second trimester; and Dermoid cyst of ovary, left on her problem list.  Patient reports no complaints.  Contractions: Not present. Vag. Bleeding: None.  Movement: Present. Denies leaking of fluid.   The following portions of the patient's history were reviewed and updated as appropriate: allergies, current medications, past family history, past medical history, past social history, past surgical history and problem list. Problem list updated.  Objective:   Vitals:   01/27/17 1049  BP: 113/77  Pulse: 72  Weight: 159 lb (72.1 kg)    Fetal Status: Fetal Heart Rate (bpm): 140 Fundal Height: 36 cm Movement: Present  Presentation: Vertex  General:  Alert, oriented and cooperative. Patient is in no acute distress.  Skin: Skin is warm and dry. No rash noted.   Cardiovascular: Normal heart rate noted  Respiratory: Normal respiratory effort, no problems with respiration noted  Abdomen: Soft, gravid, appropriate for gestational age. Pain/Pressure: Absent     Pelvic:  Cervical exam deferred        Extremities: Normal range of motion.  Edema: Trace  Mental Status: Normal mood and affect. Normal behavior. Normal judgment and thought content.   Assessment and Plan:  Pregnancy: G2P1001 at [redacted]w[redacted]d  1. Supervision of other normal pregnancy, antepartum - UTD - Strep Gp B NAA - Cervicovaginal ancillary only  2. Enterococcus UTI in pregnancy, second trimester TOC neg  Preterm labor symptoms and general obstetric precautions including but not limited to vaginal bleeding, contractions, leaking of fluid and fetal movement were reviewed in detail with the patient. Please refer to After Visit Summary  for other counseling recommendations.  Return in about 2 weeks (around 02/10/2017) for Routine prenatal care.   Caren Macadam, MD

## 2017-01-27 NOTE — Patient Instructions (Signed)
Labor Induction Labor induction is when steps are taken to cause a pregnant woman to begin the labor process. Most women go into labor on their own between 37 weeks and 42 weeks of the pregnancy. When this does not happen or when there is a medical need, methods may be used to induce labor. Labor induction causes a pregnant woman's uterus to contract. It also causes the cervix to soften (ripen), open (dilate), and thin out (efface). Usually, labor is not induced before 39 weeks of the pregnancy unless there is a problem with the baby or mother. Before inducing labor, your health care provider will consider a number of factors, including the following:  The medical condition of you and the baby.  How many weeks along you are.  The status of the baby's lung maturity.  The condition of the cervix.  The position of the baby. What are the reasons for labor induction? Labor may be induced for the following reasons:  The health of the baby or mother is at risk.  The pregnancy is overdue by 1 week or more.  The water breaks but labor does not start on its own.  The mother has a health condition or serious illness, such as high blood pressure, infection, placental abruption, or diabetes.  The amniotic fluid amounts are low around the baby.  The baby is distressed. Convenience or wanting the baby to be born on a certain date is not a reason for inducing labor. What methods are used for labor induction? Several methods of labor induction may be used, such as:  Prostaglandin medicine. This medicine causes the cervix to dilate and ripen. The medicine will also start contractions. It can be taken by mouth or by inserting a suppository into the vagina.  Inserting a thin tube (catheter) with a balloon on the end into the vagina to dilate the cervix. Once inserted, the balloon is expanded with water, which causes the cervix to open.  Stripping the membranes. Your health care provider separates  amniotic sac tissue from the cervix, causing the cervix to be stretched and causing the release of a hormone called progesterone. This may cause the uterus to contract. It is often done during an office visit. You will be sent home to wait for the contractions to begin. You will then come in for an induction.  Breaking the water. Your health care provider makes a hole in the amniotic sac using a small instrument. Once the amniotic sac breaks, contractions should begin. This may still take hours to see an effect.  Medicine to trigger or strengthen contractions. This medicine is given through an IV access tube inserted into a vein in your arm. All of the methods of induction, besides stripping the membranes, will be done in the hospital. Induction is done in the hospital so that you and the baby can be carefully monitored. How long does it take for labor to be induced? Some inductions can take up to 2-3 days. Depending on the cervix, it usually takes less time. It takes longer when you are induced early in the pregnancy or if this is your first pregnancy. If a mother is still pregnant and the induction has been going on for 2-3 days, either the mother will be sent home or a cesarean delivery will be needed. What are the risks associated with labor induction? Some of the risks of induction include:  Changes in fetal heart rate, such as too high, too low, or erratic.  Fetal distress.    Chance of infection for the mother and baby.  Increased chance of having a cesarean delivery.  Breaking off (abruption) of the placenta from the uterus (rare).  Uterine rupture (very rare). When induction is needed for medical reasons, the benefits of induction may outweigh the risks. What are some reasons for not inducing labor? Labor induction should not be done if:  It is shown that your baby does not tolerate labor.  You have had previous surgeries on your uterus, such as a myomectomy or the removal of  fibroids.  Your placenta lies very low in the uterus and blocks the opening of the cervix (placenta previa).  Your baby is not in a head-down position.  The umbilical cord drops down into the birth canal in front of the baby. This could cut off the baby's blood and oxygen supply.  You have had a previous cesarean delivery.  There are unusual circumstances, such as the baby being extremely premature. This information is not intended to replace advice given to you by your health care provider. Make sure you discuss any questions you have with your health care provider. Document Released: 12/09/2006 Document Revised: 12/26/2015 Document Reviewed: 02/16/2013 Elsevier Interactive Patient Education  2017 [employer]  

## 2017-01-28 LAB — CERVICOVAGINAL ANCILLARY ONLY
Chlamydia: NEGATIVE
NEISSERIA GONORRHEA: NEGATIVE

## 2017-01-29 LAB — STREP GP B NAA: STREP GROUP B AG: NEGATIVE

## 2017-02-10 ENCOUNTER — Ambulatory Visit (INDEPENDENT_AMBULATORY_CARE_PROVIDER_SITE_OTHER): Payer: Medicaid Other | Admitting: Family Medicine

## 2017-02-10 VITALS — BP 112/74 | HR 108 | Wt 158.0 lb

## 2017-02-10 DIAGNOSIS — O2343 Unspecified infection of urinary tract in pregnancy, third trimester: Secondary | ICD-10-CM

## 2017-02-10 DIAGNOSIS — O2342 Unspecified infection of urinary tract in pregnancy, second trimester: Secondary | ICD-10-CM

## 2017-02-10 DIAGNOSIS — Z3483 Encounter for supervision of other normal pregnancy, third trimester: Secondary | ICD-10-CM

## 2017-02-10 DIAGNOSIS — Z3480 Encounter for supervision of other normal pregnancy, unspecified trimester: Secondary | ICD-10-CM

## 2017-02-10 NOTE — Progress Notes (Signed)
   PRENATAL VISIT NOTE  Subjective:  Tiffany Chavez is a 26 y.o. G2P1001 at [redacted]w[redacted]d being seen today for ongoing prenatal care.  She is currently monitored for the following issues for this low-risk pregnancy and has Supervision of normal pregnancy, antepartum; Enterococcus UTI in pregnancy, second trimester; and Dermoid cyst of ovary, left on her problem list.  Patient reports no complaints.  Contractions: Not present. Vag. Bleeding: None.  Movement: Present. Denies leaking of fluid.   The following portions of the patient's history were reviewed and updated as appropriate: allergies, current medications, past family history, past medical history, past social history, past surgical history and problem list. Problem list updated.  Objective:   Vitals:   02/10/17 1123  BP: 112/74  Pulse: (!) 108  Weight: 158 lb (71.7 kg)    Fetal Status: Fetal Heart Rate (bpm): 134 Fundal Height: 37 cm Movement: Present  Presentation: Vertex  General:  Alert, oriented and cooperative. Patient is in no acute distress.  Skin: Skin is warm and dry. No rash noted.   Cardiovascular: Normal heart rate noted  Respiratory: Normal respiratory effort, no problems with respiration noted  Abdomen: Soft, gravid, appropriate for gestational age. Pain/Pressure: Absent     Pelvic:  Cervical exam performed Dilation: Fingertip Effacement (%): 50 Station: -3  Extremities: Normal range of motion.  Edema: Trace  Mental Status: Normal mood and affect. Normal behavior. Normal judgment and thought content.   Assessment and Plan:  Pregnancy: G2P1001 at [redacted]w[redacted]d  1. Enterococcus UTI in pregnancy, second trimester TOC neg  2. Supervision of other normal pregnancy, antepartum UTD Reviewed sweeping membranes at next visit  Term labor symptoms and general obstetric precautions including but not limited to vaginal bleeding, contractions, leaking of fluid and fetal movement were reviewed in detail with the  patient. Please refer to After Visit Summary for other counseling recommendations.  Return in about 1 week (around 02/17/2017) for Routine prenatal care.   Caren Macadam, MD

## 2017-02-18 ENCOUNTER — Encounter: Payer: Medicaid Other | Admitting: Family Medicine

## 2017-02-18 ENCOUNTER — Ambulatory Visit (INDEPENDENT_AMBULATORY_CARE_PROVIDER_SITE_OTHER): Payer: Medicaid Other | Admitting: Family Medicine

## 2017-02-18 VITALS — BP 116/75 | HR 102 | Wt 161.2 lb

## 2017-02-18 DIAGNOSIS — Z3480 Encounter for supervision of other normal pregnancy, unspecified trimester: Secondary | ICD-10-CM

## 2017-02-18 DIAGNOSIS — Z3483 Encounter for supervision of other normal pregnancy, third trimester: Secondary | ICD-10-CM

## 2017-02-18 NOTE — Progress Notes (Signed)
   PRENATAL VISIT NOTE  Subjective:  Tiffany Chavez Tiffany Chavez is a 26 y.o. G2P1001 at [redacted]w[redacted]d being seen today for ongoing prenatal care.  She is currently monitored for the following issues for this low-risk pregnancy and has Supervision of normal pregnancy, antepartum; Enterococcus UTI in pregnancy, second trimester; and Dermoid cyst of ovary, left on her problem list.  Patient reports no complaints.  Contractions: Not present. Vag. Bleeding: None.  Movement: Present. Denies leaking of fluid.   The following portions of the patient's history were reviewed and updated as appropriate: allergies, current medications, past family history, past medical history, past social history, past surgical history and problem list. Problem list updated.  Objective:   Vitals:   02/18/17 1135  BP: 116/75  Pulse: (!) 102  Weight: 161 lb 3.2 oz (73.1 kg)    Fetal Status: Fetal Heart Rate (bpm): 140   Movement: Present  Presentation: Vertex  General:  Alert, oriented and cooperative. Patient is in no acute distress.  Skin: Skin is warm and dry. No rash noted.   Cardiovascular: Normal heart rate noted  Respiratory: Normal respiratory effort, no problems with respiration noted  Abdomen: Soft, gravid, appropriate for gestational age.  Pain/Pressure: Absent     Pelvic: Cervical exam performed Dilation: 1.5 Effacement (%): 50 Station: -3  Extremities: Normal range of motion.  Edema: None  Mental Status:  Normal mood and affect. Normal behavior. Normal judgment and thought content.   Assessment and Plan:  Pregnancy: G2P1001 at [redacted]w[redacted]d  1. Supervision of other normal pregnancy, antepartum FHT and FH normal.   Term labor symptoms and general obstetric precautions including but not limited to vaginal bleeding, contractions, leaking of fluid and fetal movement were reviewed in detail with the patient. Please refer to After Visit Summary for other counseling recommendations.  No Follow-up on  file.   Truett Mainland, DO

## 2017-02-24 ENCOUNTER — Telehealth (HOSPITAL_COMMUNITY): Payer: Self-pay | Admitting: *Deleted

## 2017-02-24 ENCOUNTER — Ambulatory Visit (INDEPENDENT_AMBULATORY_CARE_PROVIDER_SITE_OTHER): Payer: Medicaid Other | Admitting: Family Medicine

## 2017-02-24 VITALS — BP 115/75 | HR 81 | Wt 161.0 lb

## 2017-02-24 DIAGNOSIS — O2342 Unspecified infection of urinary tract in pregnancy, second trimester: Secondary | ICD-10-CM

## 2017-02-24 DIAGNOSIS — Z3480 Encounter for supervision of other normal pregnancy, unspecified trimester: Secondary | ICD-10-CM

## 2017-02-24 NOTE — Patient Instructions (Signed)
  Go to the MAU (maternity admission unit) for 1) Strong contractions every 2-3 minutes for at least 1 hour that do not go away when you drink water or take a warm shower. These contractions will be so strong all you can do is breath through them 2) Vaginal bleeding- anything more than spotting 3) Loss of fluid like you broke your water 4) Decreased movement of your baby     Augmentation of Labor Augmentation of labor is when steps are taken to stimulate and strengthen uterine contractions during labor. This may be done when the contractions have slowed down or stopped, delaying progress of labor and delivery of the baby. Before beginning augmentation of labor, the health care provider will evaluate the condition of the mother and baby, the size and position of the baby, and the size of the birth canal. What are the reasons for labor augmentation? Reasons for augmentation of labor include:  Slow labor (prolonged first and second stage of labor) that has been associated with increased maternal risks, such as chorioamnionitis, postpartum hemorrhage, operative vaginal delivery, or third-degree or fourth-degree perineal lacerations.  Decreased average length of labor.  What methods are used for labor augmentation? Various methods may be used for augmentation of labor, including:  Oxytocin medicine. This medicine stimulates contractions. It is given through an IV access tube inserted into a vein.  Breaking the fluid-filled sac that surrounds the fetus (amniotic sac).  Stripping the membranes. The health care provider separates amniotic sac tissue from the cervix, causing the release of a hormone called progesterone that can stimulate uterine contractions.  Nipple stimulation.  Stimulation of certain pressure points on the ankles.  Manual or mechanical dilation of the cervix.  What are the risks associated with labor augmentation?  Overstimulation of the uterine contractions (continuous,  prolonged, very strong contractions), causing fetal distress.  Increased chance of infection for the mother and baby.  Uterine tearing (rupture).  Breaking off (abruption) of the placenta.  Increased chance of cesarean, forceps, or vacuum delivery. What are some reasons for not doing labor augmentation? Augmentation of labor should not be done if:  The baby is too big for the birth canal. This can be confirmed by ultrasonography.  The umbilical cord drops in front of the baby's head or breech part (prolapsed cord).  The mother had a previous cesarean delivery with a vertical incision in the uterus (or the kind of incision used is not known). High dose oxytocin should not be used if the mother had a previous cesarean delivery of any kind.  The mother had previous surgery on or into the uterus.  The mother has herpes.  The mother has cervical cancer.  The baby is lying sideways.  The mother's pelvis is deformed.  The mother is pregnant with more than two babies.  This information is not intended to replace advice given to you by your health care provider. Make sure you discuss any questions you have with your health care provider. Document Released: 01/12/2007 Document Revised: 01/01/2016 Document Reviewed: 02/16/2013 Elsevier Interactive Patient Education  2017 [employer].

## 2017-02-24 NOTE — Telephone Encounter (Signed)
Preadmission screen  

## 2017-02-24 NOTE — Progress Notes (Signed)
   PRENATAL VISIT NOTE  Subjective:  Tiffany Chavez [name] is a 26 y.o. G2P1001 at [redacted]w[redacted]d being seen today for ongoing prenatal care.  She is currently monitored for the following issues for this low-risk pregnancy and has Supervision of normal pregnancy, antepartum; Enterococcus UTI in pregnancy, second trimester; and Dermoid cyst of ovary, left on her problem list.  Patient reports no complaints.  Contractions: Not present. Vag. Bleeding: None.  Movement: Present. Denies leaking of fluid.   The following portions of the patient's history were reviewed and updated as appropriate: allergies, current medications, past family history, past medical history, past social history, past surgical history and problem list. Problem list updated.  Objective:   Vitals:   02/24/17 1146  BP: 115/75  Pulse: 81  Weight: 161 lb (73 kg)    Fetal Status: Fetal Heart Rate (bpm): NST   Movement: Present     General:  Alert, oriented and cooperative. Patient is in no acute distress.  Skin: Skin is warm and dry. No rash noted.   Cardiovascular: Normal heart rate noted  Respiratory: Normal respiratory effort, no problems with respiration noted  Abdomen: Soft, gravid, appropriate for gestational age.  Pain/Pressure: Absent     Pelvic: Cervical exam performed        Extremities: Normal range of motion.  Edema: None  Mental Status:  Normal mood and affect. Normal behavior. Normal judgment and thought content.   Assessment and Plan:  Pregnancy: G2P1001 at [redacted]w[redacted]d  1. Supervision of other normal pregnancy, antepartum Desires IOL after 41 week, plan for IOL on 8/4 at 7:30a Appt on 7/31 with NST  Swept membranes today, recommended returning for sweeping next week.  Term labor symptoms and general obstetric precautions including but not limited to vaginal bleeding, contractions, leaking of fluid and fetal movement were reviewed in detail with the patient. Please refer to After Visit Summary for other  counseling recommendations.  Return in about 1 week (around 03/03/2017) for Routine prenatal care with NST.   Caren Macadam, MD

## 2017-02-25 ENCOUNTER — Telehealth (HOSPITAL_COMMUNITY): Payer: Self-pay | Admitting: *Deleted

## 2017-02-25 ENCOUNTER — Encounter (HOSPITAL_COMMUNITY): Payer: Self-pay | Admitting: *Deleted

## 2017-02-25 NOTE — Telephone Encounter (Signed)
Preadmission screen  

## 2017-03-01 ENCOUNTER — Ambulatory Visit (INDEPENDENT_AMBULATORY_CARE_PROVIDER_SITE_OTHER): Payer: Medicaid Other | Admitting: Obstetrics and Gynecology

## 2017-03-01 VITALS — BP 102/69 | Wt 162.0 lb

## 2017-03-01 DIAGNOSIS — Z3480 Encounter for supervision of other normal pregnancy, unspecified trimester: Secondary | ICD-10-CM

## 2017-03-01 DIAGNOSIS — O480 Post-term pregnancy: Secondary | ICD-10-CM

## 2017-03-01 DIAGNOSIS — O2342 Unspecified infection of urinary tract in pregnancy, second trimester: Secondary | ICD-10-CM

## 2017-03-01 DIAGNOSIS — Z3483 Encounter for supervision of other normal pregnancy, third trimester: Secondary | ICD-10-CM

## 2017-03-01 DIAGNOSIS — O2343 Unspecified infection of urinary tract in pregnancy, third trimester: Secondary | ICD-10-CM

## 2017-03-01 NOTE — Progress Notes (Signed)
Prenatal Visit Note Date: 03/01/2017 Clinic: [facility]  Subjective:  Tiffany Chavez Tiffany Chavez is a 26 y.o. G2P1001 at [redacted]w[redacted]d being seen today for ongoing prenatal care.  She is currently monitored for the following issues for this low-risk pregnancy and has Supervision of normal pregnancy, antepartum; Enterococcus UTI in pregnancy, second trimester; and Dermoid cyst of ovary, left on her problem list.  Patient reports no complaints.   Contractions: Irregular.  .  Movement: Present. Denies leaking of fluid.   The following portions of the patient's history were reviewed and updated as appropriate: allergies, current medications, past family history, past medical history, past social history, past surgical history and problem list. Problem list updated.  Objective:   Vitals:   03/01/17 1514  BP: 102/69  Weight: 162 lb (73.5 kg)    Fetal Status: Fetal Heart Rate (bpm): rNST Fundal Height: 40 cm Movement: Present  Presentation: Vertex  General:  Alert, oriented and cooperative. Patient is in no acute distress.  Skin: Skin is warm and dry. No rash noted.   Cardiovascular: Normal heart rate noted  Respiratory: Normal respiratory effort, no problems with respiration noted  Abdomen: Soft, gravid, appropriate for gestational age. Pain/Pressure: Absent     Pelvic:  Cervical exam deferred        Extremities: Normal range of motion.     Mental Status: Normal mood and affect. Normal behavior. Normal judgment and thought content.   Urinalysis:      Assessment and Plan:  Pregnancy: G2P1001 at [redacted]w[redacted]d  1. Supervision of other normal pregnancy, antepartum Routine care  2. Post-term pregnancy, 40-42 weeks of gestation RNST. AFI 16, cephalic. Desires IOL after 41wks. Pt set up for saturday - Fetal nonstress test  3. Enterococcus UTI in pregnancy, second trimester TOC neg  Preterm labor symptoms and general obstetric precautions including but not limited to vaginal  bleeding, contractions, leaking of fluid and fetal movement were reviewed in detail with the patient. Please refer to After Visit Summary for other counseling recommendations.  RTC 3d for NST   Aletha Halim, MD

## 2017-03-02 ENCOUNTER — Inpatient Hospital Stay (HOSPITAL_COMMUNITY): Payer: Medicaid Other

## 2017-03-04 ENCOUNTER — Ambulatory Visit (INDEPENDENT_AMBULATORY_CARE_PROVIDER_SITE_OTHER): Payer: Medicaid Other | Admitting: *Deleted

## 2017-03-04 VITALS — BP 111/74 | HR 98 | Wt 163.0 lb

## 2017-03-04 DIAGNOSIS — O480 Post-term pregnancy: Secondary | ICD-10-CM

## 2017-03-06 ENCOUNTER — Inpatient Hospital Stay (HOSPITAL_COMMUNITY)
Admission: RE | Admit: 2017-03-06 | Discharge: 2017-03-08 | DRG: 775 | Disposition: A | Discharge status: Home / Self Care | Payer: Medicaid Other | Source: Ambulatory Visit | Attending: Obstetrics and Gynecology | Admitting: Obstetrics and Gynecology

## 2017-03-06 ENCOUNTER — Encounter (HOSPITAL_COMMUNITY): Payer: Self-pay

## 2017-03-06 ENCOUNTER — Inpatient Hospital Stay (HOSPITAL_COMMUNITY): Payer: Medicaid Other | Admitting: Anesthesiology

## 2017-03-06 DIAGNOSIS — O480 Post-term pregnancy: Principal | ICD-10-CM | POA: Diagnosis present

## 2017-03-06 DIAGNOSIS — D649 Anemia, unspecified: Secondary | ICD-10-CM | POA: Diagnosis present

## 2017-03-06 DIAGNOSIS — Z3480 Encounter for supervision of other normal pregnancy, unspecified trimester: Secondary | ICD-10-CM

## 2017-03-06 DIAGNOSIS — Z3A41 41 weeks gestation of pregnancy: Secondary | ICD-10-CM

## 2017-03-06 DIAGNOSIS — D271 Benign neoplasm of left ovary: Secondary | ICD-10-CM

## 2017-03-06 DIAGNOSIS — O2342 Unspecified infection of urinary tract in pregnancy, second trimester: Secondary | ICD-10-CM

## 2017-03-06 DIAGNOSIS — O9902 Anemia complicating childbirth: Secondary | ICD-10-CM | POA: Diagnosis present

## 2017-03-06 LAB — CBC
HCT: 33.3 % — ABNORMAL LOW (ref 36.0–46.0)
Hemoglobin: 10.8 g/dL — ABNORMAL LOW (ref 12.0–15.0)
MCH: 25.5 pg — ABNORMAL LOW (ref 26.0–34.0)
MCHC: 32.4 g/dL (ref 30.0–36.0)
MCV: 78.5 fL (ref 78.0–100.0)
PLATELETS: 225 10*3/uL (ref 150–400)
RBC: 4.24 MIL/uL (ref 3.87–5.11)
RDW: 16.3 % — AB (ref 11.5–15.5)
WBC: 10.7 10*3/uL — AB (ref 4.0–10.5)

## 2017-03-06 LAB — TYPE AND SCREEN
ABO/RH(D): A POS
ANTIBODY SCREEN: NEGATIVE

## 2017-03-06 LAB — RPR: RPR Ser Ql: NONREACTIVE

## 2017-03-06 MED ORDER — LIDOCAINE HCL (PF) 1 % IJ SOLN
INTRAMUSCULAR | Status: DC | PRN
  Administered 2017-03-06: 2 mL

## 2017-03-06 MED ORDER — BENZOCAINE-MENTHOL 20-0.5 % EX AERO
1.0000 "application " | INHALATION_SPRAY | CUTANEOUS | Status: DC | PRN
  Administered 2017-03-07: 1 via TOPICAL
  Filled 2017-03-06: qty 56

## 2017-03-06 MED ORDER — ACETAMINOPHEN 325 MG PO TABS: 650.0000 mg | ORAL_TABLET | ORAL | Status: DC | PRN

## 2017-03-06 MED ORDER — DIBUCAINE 1 % RE OINT: 1.0000 "application " | TOPICAL_OINTMENT | RECTAL | Status: DC | PRN

## 2017-03-06 MED ORDER — FLEET ENEMA 7-19 GM/118ML RE ENEM: 1.0000 | ENEMA | RECTAL | Status: DC | PRN

## 2017-03-06 MED ORDER — DIPHENHYDRAMINE HCL 50 MG/ML IJ SOLN: 12.5000 mg | INTRAMUSCULAR | Status: DC | PRN

## 2017-03-06 MED ORDER — OXYTOCIN 40 UNITS IN LACTATED RINGERS INFUSION - SIMPLE MED
2.5000 [IU]/h | INTRAVENOUS | Status: DC
  Filled 2017-03-06: qty 1000

## 2017-03-06 MED ORDER — MISOPROSTOL 25 MCG QUARTER TABLET
25.0000 ug | ORAL_TABLET | ORAL | Status: DC | PRN
  Administered 2017-03-06 (×2): 25 ug via VAGINAL
  Filled 2017-03-06 (×4): qty 1

## 2017-03-06 MED ORDER — DIPHENHYDRAMINE HCL 25 MG PO CAPS: 25.0000 mg | ORAL_CAPSULE | Freq: Four times a day (QID) | ORAL | Status: DC | PRN

## 2017-03-06 MED ORDER — TERBUTALINE SULFATE 1 MG/ML IJ SOLN
0.2500 mg | Freq: Once | INTRAMUSCULAR | Status: DC | PRN
  Filled 2017-03-06: qty 1

## 2017-03-06 MED ORDER — SENNOSIDES-DOCUSATE SODIUM 8.6-50 MG PO TABS
2.0000 | ORAL_TABLET | ORAL | Status: DC
  Administered 2017-03-07 – 2017-03-08 (×2): 2 via ORAL
  Filled 2017-03-06 (×2): qty 2

## 2017-03-06 MED ORDER — LACTATED RINGERS IV SOLN: 500.0000 mL | Freq: Once | INTRAVENOUS | Status: DC

## 2017-03-06 MED ORDER — PHENYLEPHRINE 40 MCG/ML (10ML) SYRINGE FOR IV PUSH (FOR BLOOD PRESSURE SUPPORT)
80.0000 ug | PREFILLED_SYRINGE | INTRAVENOUS | Status: DC | PRN
  Filled 2017-03-06: qty 10
  Filled 2017-03-06: qty 5

## 2017-03-06 MED ORDER — SOD CITRATE-CITRIC ACID 500-334 MG/5ML PO SOLN: 30.0000 mL | ORAL | Status: DC | PRN

## 2017-03-06 MED ORDER — ONDANSETRON HCL 4 MG PO TABS: 4.0000 mg | ORAL_TABLET | ORAL | Status: DC | PRN

## 2017-03-06 MED ORDER — EPHEDRINE 5 MG/ML INJ
10.0000 mg | INTRAVENOUS | Status: DC | PRN
  Filled 2017-03-06: qty 2

## 2017-03-06 MED ORDER — ONDANSETRON HCL 4 MG/2ML IJ SOLN: 4.0000 mg | Freq: Four times a day (QID) | INTRAMUSCULAR | Status: DC | PRN

## 2017-03-06 MED ORDER — OXYTOCIN BOLUS FROM INFUSION: 500.0000 mL | Freq: Once | INTRAVENOUS | Status: DC

## 2017-03-06 MED ORDER — ONDANSETRON HCL 4 MG/2ML IJ SOLN
4.0000 mg | INTRAMUSCULAR | Status: DC | PRN
Start: 2017-03-06 — End: 2017-03-08

## 2017-03-06 MED ORDER — FENTANYL 2.5 MCG/ML BUPIVACAINE 1/10 % EPIDURAL INFUSION (WH - ANES)
14.0000 mL/h | INTRAMUSCULAR | Status: DC | PRN
  Filled 2017-03-06: qty 100

## 2017-03-06 MED ORDER — ZOLPIDEM TARTRATE 5 MG PO TABS: 5.0000 mg | ORAL_TABLET | Freq: Every evening | ORAL | Status: DC | PRN

## 2017-03-06 MED ORDER — OXYCODONE-ACETAMINOPHEN 5-325 MG PO TABS: 1.0000 | ORAL_TABLET | ORAL | Status: DC | PRN

## 2017-03-06 MED ORDER — PRENATAL MULTIVITAMIN CH
1.0000 | ORAL_TABLET | Freq: Every day | ORAL | Status: DC
  Administered 2017-03-07 – 2017-03-08 (×2): 1 via ORAL
  Filled 2017-03-06 (×2): qty 1

## 2017-03-06 MED ORDER — WITCH HAZEL-GLYCERIN EX PADS: 1.0000 "application " | MEDICATED_PAD | CUTANEOUS | Status: DC | PRN

## 2017-03-06 MED ORDER — LACTATED RINGERS IV SOLN
INTRAVENOUS | Status: DC
  Administered 2017-03-06: 125 mL/h via INTRAVENOUS
  Administered 2017-03-06: 08:00:00 via INTRAVENOUS

## 2017-03-06 MED ORDER — IBUPROFEN 600 MG PO TABS
600.0000 mg | ORAL_TABLET | Freq: Four times a day (QID) | ORAL | Status: DC
  Administered 2017-03-06 – 2017-03-08 (×7): 600 mg via ORAL
  Filled 2017-03-06 (×7): qty 1

## 2017-03-06 MED ORDER — LACTATED RINGERS IV SOLN: 500.0000 mL | INTRAVENOUS | Status: DC | PRN

## 2017-03-06 MED ORDER — COCONUT OIL OIL
1.0000 "application " | TOPICAL_OIL | Status: DC | PRN
  Administered 2017-03-07: 1 via TOPICAL
  Filled 2017-03-06: qty 120

## 2017-03-06 MED ORDER — TETANUS-DIPHTH-ACELL PERTUSSIS 5-2.5-18.5 LF-MCG/0.5 IM SUSP: 0.5000 mL | Freq: Once | INTRAMUSCULAR | Status: DC

## 2017-03-06 MED ORDER — PHENYLEPHRINE 40 MCG/ML (10ML) SYRINGE FOR IV PUSH (FOR BLOOD PRESSURE SUPPORT)
80.0000 ug | PREFILLED_SYRINGE | INTRAVENOUS | Status: DC | PRN
  Filled 2017-03-06: qty 5

## 2017-03-06 MED ORDER — FENTANYL CITRATE (PF) 100 MCG/2ML IJ SOLN: 100.0000 ug | INTRAMUSCULAR | Status: DC | PRN

## 2017-03-06 MED ORDER — LIDOCAINE HCL (PF) 1 % IJ SOLN
30.0000 mL | INTRAMUSCULAR | Status: DC | PRN
  Filled 2017-03-06: qty 30

## 2017-03-06 MED ORDER — SIMETHICONE 80 MG PO CHEW: 80.0000 mg | CHEWABLE_TABLET | ORAL | Status: DC | PRN

## 2017-03-06 MED ORDER — OXYCODONE-ACETAMINOPHEN 5-325 MG PO TABS: 2.0000 | ORAL_TABLET | ORAL | Status: DC | PRN

## 2017-03-06 NOTE — Progress Notes (Signed)
Epidural cath pulled. It was never hooked up to pump because patient delivered. Patient only received loading dose.  Glade Nurse, RN

## 2017-03-06 NOTE — Progress Notes (Signed)
SVe 1.5cm/50%/-3 FB placed FHT cat I  Jenne Pane. Alleah Dearman, MD OB Fellow

## 2017-03-06 NOTE — H&P (Signed)
LABOR AND DELIVERY ADMISSION HISTORY AND PHYSICAL NOTE  Sasha A Covarrubias [name] is a 26 y.o. female G2P1001 with IUP at [redacted]w[redacted]d by LMP presenting for IOL for postdates.   She reports positive fetal movement. She denies leakage of fluid or vaginal bleeding.  Prenatal History/Complications: Clinic [facility] Prenatal Labs  Dating LMP c/w 15wk [place] Blood type: A/Positive/-- (02/01 1104)   Genetic Screen Quad:  Negative Antibody:Negative (02/01 1104)  Anatomic [place] IWNL Rubella: 1.98 (02/01 1104)  GTT  Third trimester: wnl RPR: Non Reactive (02/01 1104)   Flu vaccine 05/2016 HBsAg: Negative (02/01 1104)   TDaP vaccine  12/30/2016                                         HIV: Non Reactive (02/01 1104)   Baby Food Breast                                           XWR:[id number] (06/27 1030)(For PCN allergy, check sensitivities)  Contraception IUD- likely LNG Pap: [place] 05/2016 [ ]  results  Circumcision NA- girl   [place]      Past Medical History: Past Medical History:  Diagnosis Date  . Medical history non-contributory     Past Surgical History: Past Surgical History:  Procedure Laterality Date  . RHINOPLASTY      Obstetrical History: OB History    Gravida Para Term Preterm AB Living   2 1 1  0 0 1   SAB TAB Ectopic Multiple Live Births   0 0 0 0 1      Social History: Social History   Social History  . Marital status: Single    Spouse name: N/A  . Number of children: N/A  . Years of education: N/A   Social History Main Topics  . Smoking status: Never Smoker  . Smokeless tobacco: Never Used  . Alcohol use No  . Drug use: No  . Sexual activity: Yes    Partners: Male   Other Topics Concern  . None   Social History Narrative  . None    Family History: History reviewed. No pertinent family history.  Allergies: No Known Allergies  Prescriptions Prior to Admission  Medication Sig Dispense Refill Last Dose  .  Prenat-FeFum-FePo-FA-Omega 3 (CONCEPT DHA) 53.5-38-1 MG CAPS Take 1 tablet by mouth daily. 30 capsule 8 Taking  . promethazine (PHENERGAN) 25 MG tablet Take 1 tablet (25 mg total) by mouth every 6 (six) hours as needed for nausea or vomiting. 30 tablet 2 Taking     Review of Systems   All systems reviewed and negative except as stated in HPI  Blood pressure 109/60, pulse 95, temperature 98.5 F (36.9 C), temperature source Oral, resp. rate 18, height 5\' 2"  (1.575 m), weight 163 lb (73.9 kg), last menstrual period 05/19/2016, unknown if currently breastfeeding. General appearance: alert, cooperative and appears stated age Lungs: normal WOB. No respiratory distress Heart: regular rate Abdomen: soft, non-tender; gravid Extremities: No calf swelling or tenderness Presentation: cephalic Fetal monitoring: baseline 130, moderate variability, +acel, no decel Uterine activity: irritability Dilation: 1 Effacement (%): 30 Exam by:: Dr Lambert Keto   Prenatal labs: ABO, Rh: A/Positive/-- (02/01 1104) Antibody: Negative (02/01 1104) Rubella: !Error! RPR: Non Reactive (05/16 1191)  HBsAg: Negative (02/01 1104)  HIV:   nonreactive GBS: Negative (06/27 1030)  GTT: normal Genetic screening:  Negative quad Anatomy US: normal  Prenatal Transfer Tool  Maternal Diabetes: No Genetic Screening: Normal Maternal Ultrasounds/Referrals: Normal Fetal Ultrasounds or other Referrals:  None Maternal Substance Abuse:  No Significant Maternal Medications:  None Significant Maternal Lab Results: None  Results for orders placed or performed during the hospital encounter of 03/06/17 (from the past 24 hour(s))  CBC   Collection Time: 03/06/17  8:04 AM  Result Value Ref Range   WBC 10.7 (H) 4.0 - 10.5 K/uL   RBC 4.24 3.87 - 5.11 MIL/uL   Hemoglobin 10.8 (L) 12.0 - 15.0 g/dL   HCT 33.3 (L) 36.0 - 46.0 %   MCV 78.5 78.0 - 100.0 fL   MCH 25.5 (L) 26.0 - 34.0 pg   MCHC 32.4 30.0 - 36.0 g/dL   RDW 16.3 (H)  11.5 - 15.5 %   Platelets 225 150 - 400 K/uL    Patient Active Problem List   Diagnosis Date Noted  . Normal labor 03/06/2017  . Dermoid cyst of ovary, left 11/19/2016  . Enterococcus UTI in pregnancy, second trimester 09/08/2016  . Supervision of normal pregnancy, antepartum 09/03/2016    Assessment: Kammy A Covarrubias [name] is a 26 y.o. G2P1001 at [redacted]w[redacted]d here for IOL for postdates,  #Labor: cytotec placed for cervical ripening; will place FB with next check. #Pain: Planning on epidural when in active labor #FWB: Cat I #ID:  GBS negativ #MOF: breast #MOC:IUD #Circ:  N/a (girl)  Jenne Pane Degele 03/06/2017, 9:54 AM

## 2017-03-06 NOTE — Anesthesia Procedure Notes (Signed)
Epidural Patient location during procedure: OB Start time: 03/06/2017 6:37 PM End time: 03/06/2017 6:45 PM  Preanesthetic Checklist Completed: patient identified, site marked, surgical consent, pre-op evaluation, timeout performed, IV checked, risks and benefits discussed and monitors and equipment checked  Epidural Patient position: sitting Prep: site prepped and draped and DuraPrep Patient monitoring: continuous pulse ox and blood pressure Approach: midline Location: L4-L5 Injection technique: LOR air  Needle:  Needle type: Tuohy  Needle gauge: 17 G Needle length: 9 cm and 9 Needle insertion depth: 7 cm Catheter type: closed end flexible Catheter size: 19 Gauge Catheter at skin depth: 12 cm Test dose: negative  Assessment Events: blood not aspirated, injection not painful, no injection resistance and no paresthesia

## 2017-03-06 NOTE — Anesthesia Pain Management Evaluation Note (Signed)
  CRNA Pain Management Visit Note  Patient: Tiffany Chavez, 26 y.o., female  "Hello I am a member of the anesthesia team at [facility]. We have an anesthesia team available at all times to provide care throughout the hospital, including epidural management and anesthesia for C-section. I don't know your plan for the delivery whether it a natural birth, water birth, IV sedation, nitrous supplementation, doula or epidural, but we want to meet your pain goals."   1.Was your pain managed to your expectations on prior hospitalizations?   Yes   2.What is your expectation for pain management during this hospitalization?     Epidural  3.How can we help you reach that goal? epidural  Record the patient's initial score and the patient's pain goal.   Pain: 0  Pain Goal: 5 The [facility] wants you to be able to say your pain was always managed very well.  Brok Stocking 03/06/2017

## 2017-03-06 NOTE — Progress Notes (Signed)
LABOR PROGRESS NOTE  Zahra A Covarrubias [name] is a 26 y.o. G2P1001 at [redacted]w[redacted]d  admitted for IOL for postdates  Subjective: Feeling contractions. Would like epidural.  Objective: BP (!) 117/53   Pulse 83   Temp 97.8 F (36.6 C) (Oral)   Resp 20   Ht 5\' 2"  (1.575 m)   Wt 163 lb (73.9 kg)   LMP 05/19/2016   BMI 29.81 kg/m  or  Vitals:   03/06/17 1301 03/06/17 1454 03/06/17 1557 03/06/17 1727  BP: 98/64 106/64 107/73 (!) 117/53  Pulse: 79 90 81 83  Resp: 17 16 20 20   Temp:      TempSrc:      Weight:      Height:        Dilation: 7 Effacement (%): 100 Cervical Position: Posterior Station: -1 Presentation: Vertex Exam by:: dr degele FHT: baseline rate 135, moderate varibility, +acel, no decel Toco: ctx q2-3 min   Assessment / Plan: 26 y.o. G2P1001 at [redacted]w[redacted]d here for IOL for postdates  Labor: s/p FB and cytotec x 2. Now in active labor. AROM at 1810, clear fluid. Expectant management Fetal Wellbeing:  Cat I Pain Control:  Will get epidufral Anticipated MOD:  SVD  Gailen Shelter, MD 03/06/2017, 6:22 PM

## 2017-03-06 NOTE — Anesthesia Preprocedure Evaluation (Signed)
Anesthesia Evaluation  Patient identified by MRN, date of birth, ID band Patient awake    Reviewed: Allergy & Precautions, Patient's Chart, lab work & pertinent test results  Airway Mallampati: II  TM Distance: >3 FB Neck ROM: Full    Dental   Pulmonary neg pulmonary ROS,    breath sounds clear to auscultation       Cardiovascular negative cardio ROS   Rhythm:Regular Rate:Normal     Neuro/Psych negative neurological ROS     GI/Hepatic negative GI ROS, Neg liver ROS,   Endo/Other  negative endocrine ROS  Renal/GU negative Renal ROS     Musculoskeletal   Abdominal   Peds  Hematology  (+) anemia ,   Anesthesia Other Findings   Reproductive/Obstetrics (+) Pregnancy                             Anesthesia Physical Anesthesia Plan  ASA: II  Anesthesia Plan: Epidural   Post-op Pain Management:    Induction:   PONV Risk Score and Plan: Treatment may vary due to age or medical condition  Airway Management Planned: Natural Airway  Additional Equipment:   Intra-op Plan:   Post-operative Plan:   Informed Consent: I have reviewed the patients History and Physical, chart, labs and discussed the procedure including the risks, benefits and alternatives for the proposed anesthesia with the patient or authorized representative who has indicated his/her understanding and acceptance.     Plan Discussed with:   Anesthesia Plan Comments:         Anesthesia Quick Evaluation

## 2017-03-06 NOTE — Progress Notes (Signed)
NST performed today was reviewed and was found to be reactive.  Continue recommended antenatal testing and prenatal care.  

## 2017-03-07 NOTE — Lactation Note (Signed)
This note was copied from a baby's chart. Lactation Consultation Note Mom's 2nd child. Mom stated she BF her 1st child but had low milk supply and stopped.  RN into assess mom and then mom wants to sleep. Encouraged to post pump to encouraged her milk supply and LC would visit later. Mom stated ok.  Patient Name: Tiffany Chavez Today's Date: 03/07/2017 Reason for consult: Initial assessment   Maternal Data    Feeding Feeding Type: Breast Milk Length of feed: 5 min  LATCH Score                   Interventions    Lactation Tools Discussed/Used     Consult Status Consult Status: Follow-up Date: 03/07/17 Follow-up type: In-patient    Kriston Mckinnie, Elta Guadeloupe 03/07/2017, 1:07 AM

## 2017-03-07 NOTE — Anesthesia Postprocedure Evaluation (Signed)
Anesthesia Post Note  Patient: Tiffany Chavez  Procedure(s) Performed: * No procedures listed *     Patient location during evaluation: Mother Baby Anesthesia Type: Epidural Level of consciousness: awake and alert and oriented Pain management: satisfactory to patient Vital Signs Assessment: post-procedure vital signs reviewed and stable Respiratory status: spontaneous breathing and nonlabored ventilation Cardiovascular status: stable Postop Assessment: no headache, no backache, no signs of nausea or vomiting, adequate PO intake and patient able to bend at knees (patient up walking) Anesthetic complications: no    Last Vitals:  Vitals:   03/07/17 0111 03/07/17 0517  BP: 108/67 122/81  Pulse: 95 71  Resp: 18 18  Temp: 36.7 C 36.8 C    Last Pain:  Vitals:   03/07/17 0521  TempSrc:   PainSc: 2    Pain Goal:                 Willa Rough

## 2017-03-07 NOTE — Progress Notes (Signed)
Post Partum Day 1  Subjective:  Tiffany Chavez is a 26 y.o. [id number] [redacted]w[redacted]d s/p SVD.  No acute events overnight.  Pt denies problems with ambulating, voiding or po intake.  She denies nausea or vomiting.  Pain is well controlled.  She has had flatus. She has not had bowel movement.  Lochia Minimal.  Plan for birth control is IUD.  Method of Feeding: Breast  Objective: BP 122/81   Pulse 71   Temp 98.2 F (36.8 C) (Oral)   Resp 18   Ht 5\' 2"  (1.575 m)   Wt 163 lb (73.9 kg)   LMP 05/19/2016   SpO2 100%   Breastfeeding? Unknown   BMI 29.81 kg/m   Physical Exam:  General: alert, cooperative and no distress Lochia:normal flow Chest: Normal WOB Heart: RRR no m/r/g Abdomen: +BS, soft, nontender, fundus firm at/below umbilicus Uterine Fundus: firm DVT Evaluation: No evidence of DVT seen on physical exam. Extremities: trace edema   Recent Labs  03/06/17 0804  HGB 10.8*  HCT 33.3*    Assessment/Plan:  ASSESSMENT: Tiffany Chavez is a 26 y.o. [id number] [redacted]w[redacted]d ppd #2 s/p NSVD/VAVD/FAVD doing well.   Plan for discharge tomorrow and Contraception IUD   LOS: 1 day   Windell Moment 03/07/2017, 7:23 AM

## 2017-03-08 MED ORDER — IBUPROFEN 600 MG PO TABS: 600.0000 mg | ORAL_TABLET | Freq: Four times a day (QID) | ORAL | 0 refills | Status: DC | PRN

## 2017-03-08 NOTE — Care Management Note (Signed)
Case Management Note  Patient Details  Name: Tiffany Chavez MRN: [id number] Date of Birth: Dec 21, 1990  Subjective/Objective:  26 year old female admitted   03/06/17 S/P vaginal delivery.               Action/Plan:D/C when medically stable.               Expected Discharge Plan:  Home/Self Care  Status of Service:  Completed, signed off  :  Aida Raider RNC-MNN, BSN 03/08/2017, 3:40 PM

## 2017-03-08 NOTE — Lactation Note (Signed)
This note was copied from a baby's chart. Lactation Consultation Note  Patient Name: Tiffany Chavez Today's Date: 03/08/2017   Mother has baby latched upon entering in football hold on L side. Sucks and swallows observed. Mother states she is more sore on R side.  Recommend ebm and coconut oil and sustaining a deep latch. Encouraged mother to compress breast during feeding to keep baby active. Provided [facility] loaner pump and described how breastmilk comes to volume. Reviewed milk storage.      Maternal Data    Feeding    LATCH Score                   Interventions    Lactation Tools Discussed/Used     Consult Status      Carlye Grippe 03/08/2017, 6:10 PM

## 2017-03-08 NOTE — Discharge Instructions (Signed)

## 2017-03-08 NOTE — Lactation Note (Signed)
This note was copied from a baby's chart. Lactation Consultation Note Mom has Hx; of low milk supply w/her first child. Mom BF 5 months but had to supplement afterwards d/t could only get 1 oz.  Mom has cone shaped breast, everted nipples, compressibile. Nipples sore, mom using coconut oil.  Baby only BF 5-10 min. Rm. Very warm, baby wrapped in blankets and wearing hat. Baby skin slightly ruddy. Discussed w/mom to BF STS, no blankets turn heat to 72. Mom skin clammy d/t being hot. Baby skin warm to touch.  Baby jaundice in appearance. TCB elevated serum to be drawn. In 34 hrs of life had 4 voids and 6 stools.  LC hand express colostrum 1 drop. Suggest to mom supplementing after BF until her milk comes in more or breast fill w/transitional milk. Supplementing feeding information sheet given and reviewed. Alimentum given. Mom is using DEBP every 3 hrs w/nothing excreted. Reported to RN of feeding plan. Mom stated she understands feeding plan. Patient Name: Tiffany Chavez Today's Date: 03/08/2017 Reason for consult: Follow-up assessment   Maternal Data Has patient been taught Hand Expression?: Yes Does the patient have breastfeeding experience prior to this delivery?: Yes  Feeding Feeding Type: Breast Fed Length of feed: 10 min  LATCH Score Latch: Grasps breast easily, tongue down, lips flanged, rhythmical sucking.  Audible Swallowing: A few with stimulation  Type of Nipple: Everted at rest and after stimulation  Comfort (Breast/Nipple): Filling, red/small blisters or bruises, mild/mod discomfort  Hold (Positioning): Assistance needed to correctly position infant at breast and maintain latch.  LATCH Score: 7  Interventions Interventions: Breast feeding basics reviewed;Support pillows;Assisted with latch;Position options;Skin to skin;Breast massage;Coconut oil;Hand express;Breast compression;Adjust position;DEBP  Lactation Tools Discussed/Used Tools: Pump;Coconut  oil Breast pump type: Double-Electric Breast Pump Pump Review: Setup, frequency, and cleaning;Milk Storage Initiated by:: RN Date initiated:: 03/07/17   Consult Status Consult Status: Follow-up Date: 03/08/17 (in pm) Follow-up type: In-patient    Niyati Heinke, Elta Guadeloupe 03/08/2017, 5:12 AM

## 2017-03-08 NOTE — Discharge Summary (Signed)
OB Discharge Summary     Patient Name: Tiffany Chavez DOB: 26-Mar-1991 MRN: [id number]  Date of admission: 03/06/2017 Delivering MD: Gailen Shelter   Date of discharge: 03/08/2017  Admitting diagnosis: 66WKS INDUCTION Intrauterine pregnancy: [redacted]w[redacted]d     Secondary diagnosis:  Active Problems:   Normal labor  Additional problems: none     Discharge diagnosis: Term Pregnancy Delivered                                                                                                Post partum procedures:none  Augmentation: AROM, Cytotec and Foley Balloon  Complications: None  Hospital course:  Induction of Labor With Vaginal Delivery   26 y.o. yo [id number] at [redacted]w[redacted]d was admitted to the hospital 03/06/2017 for induction of labor.  Indication for induction: Postdates.  Patient had an uncomplicated labor course as follows: Membrane Rupture Time/Date: 6:10 PM ,03/06/2017   Intrapartum Procedures: Episiotomy: None [1]                                         Lacerations:  None [1]  Patient had delivery of a Viable infant.  Information for the patient's newborn:  Delita Chiquito, Girl Tomika [[id number]]  Delivery Method: Vaginal, Spontaneous Delivery (Filed from Delivery Summary)   03/06/2017  Details of delivery can be found in separate delivery note.  Patient had a routine postpartum course. Patient is discharged home 03/08/17.  Physical exam  Vitals:   03/07/17 0111 03/07/17 0517 03/07/17 1852 03/08/17 0633  BP: 108/67 122/81 116/68 100/68  Pulse: 95 71 73 78  Resp: 18 18 18 16   Temp: 98 F (36.7 C) 98.2 F (36.8 C) 98.3 F (36.8 C) 98 F (36.7 C)  TempSrc: Oral Oral Oral Oral  SpO2:    99%  Weight:      Height:       General: alert and cooperative Lochia: appropriate Uterine Fundus: firm Incision: N/A DVT Evaluation: No evidence of DVT seen on physical exam. Labs: Lab Results  Component Value Date   WBC 10.7 (H) 03/06/2017   HGB 10.8 (L) 03/06/2017   HCT 33.3 (L) 03/06/2017   MCV 78.5 03/06/2017   PLT 225 03/06/2017   No flowsheet data found.  Discharge instruction: per After Visit Summary and "Baby and Me Booklet".  After visit meds:  Allergies as of 03/08/2017   No Known Allergies     Medication List    STOP taking these medications   promethazine 25 MG tablet Commonly known as:  PHENERGAN     TAKE these medications   CONCEPT DHA 53.5-38-1 MG Caps Take 1 tablet by mouth daily.   ibuprofen 600 MG tablet Commonly known as:  ADVIL,MOTRIN Take 1 tablet (600 mg total) by mouth every 6 (six) hours as needed.       Diet: routine diet  Activity: Advance as tolerated. Pelvic rest for 6 weeks.   Outpatient follow up:4 weeks Follow up Appt:No future appointments. Follow up Visit:No Follow-up  on file.  Postpartum contraception: IUD Mirena  Newborn Data: Live born female  Birth Weight: 7 lb 0.2 oz (3181 g) APGAR: 9, 9  Baby Feeding: Breast Disposition:home with mother   03/08/2017 Serita Grammes, CNM  8:32 AM

## 2017-03-08 NOTE — Lactation Note (Signed)
This note was copied from a baby's chart. Lactation Consultation Note: Mother complaints of slight pinching pain on nipple when infant is feeding. Observed nipple tissue to be intact. Mother assist with properly positioning infant in football hold. Advised mother to use c hold when latching infant. Infant latched well. Mother denies any discomfort with latch. Observed frequent suckles and swallows. Advised mother to post pump and supplement infant with ebm first and then supplement with formula. Mother advised to rouse infant well with skin to skin each feeding. Discussed cluster feeding and feeding infant 8-12 times in 24 hours. Mother advised to breastfeed often to prevent engorgement. Mother advised in treatment of engorgement. Mother is active with [facility]. She has an appt next month. WIC saw mother this am. Mother wants to take home a [facility] Loaner pump. She has a harmony hand pump. Mother advised to page to get pump when ready for discharge.  Patient Name: Tiffany Chavez Today's Date: 03/08/2017 Reason for consult: Follow-up assessment   Maternal Data    Feeding Feeding Type: Breast Fed Length of feed: 20 min  LATCH Score Latch: Grasps breast easily, tongue down, lips flanged, rhythmical sucking.  Audible Swallowing: Spontaneous and intermittent  Type of Nipple: Everted at rest and after stimulation  Comfort (Breast/Nipple): Filling, red/small blisters or bruises, mild/mod discomfort  Hold (Positioning): Assistance needed to correctly position infant at breast and maintain latch.  LATCH Score: 8  Interventions Interventions: Assisted with latch;Skin to skin;Breast compression;Adjust position;Support pillows;Expressed milk  Lactation Tools Discussed/Used     Consult Status      Tiffany Chavez 03/08/2017, 10:41 AM

## 2017-03-09 ENCOUNTER — Telehealth: Payer: Self-pay | Admitting: Radiology

## 2017-03-09 NOTE — Telephone Encounter (Signed)
Spoke with patient on 03/09/17. Called her to schedule her postpartum appointment at [place]. She states that she will call back next week to make appointment when she finds out which day she can come.

## 2017-03-09 NOTE — Telephone Encounter (Signed)
Mom called as she was not able to get her DEBP to function. She did not have small connector connected to the back of the pump. Mom reports infant has had 4 wet diapers today and no stools. She asked if she should start formula. Mom reports she is feeling fuller today and hears infant swallowing. She reports the infant has a small mouth and sometimes has difficulty latching deeply. Mom reports no to minimal pain once she gets infant latched deeply. Advised mom to pump after each BF and offer infant supplement of EBM. Offered mom OP appt and she said she would like to wait to see if she stools by tomorrow and will call back. Enc mom to call back tonight or in the morning if no stool to schedule an OP appt.

## 2017-04-06 NOTE — Progress Notes (Signed)
Post Partum Exam  Tiffany Chavez Tiffany Chavez is a 26 y.o. G70P2002 female who presents for a postpartum visit. She is 4 weeks postpartum following a spontaneous vaginal delivery. I have fully reviewed the prenatal and intrapartum course. The delivery was at 32 gestational weeks.  Anesthesia: none. Postpartum course has been . Baby's course has been uncomplicated. Baby is feeding by both breast and bottle - Similac Advance. Bleeding staining only. Bowel function is normal. Bladder function is normal. Patient is not sexually active. Contraception method is IUD. Postpartum depression screening:1  The following portions of the patient's history were reviewed and updated as appropriate: allergies, current medications, past family history, past medical history, past social history, past surgical history and problem list.  Review of Systems Pertinent items are noted in HPI.    Objective:  unknown if currently breastfeeding.  General:  alert, cooperative and appears stated age   Breasts:  inspection negative, no nipple discharge or bleeding, no masses or nodularity palpable  Lungs: NWOB  Heart:  RR  Abdomen: soft, non-tender; bowel sounds normal; no masses,  no organomegaly   Vulva:  normal  Vagina: normal vagina  Cervix:  multiparous appearance  Corpus: normal  Adnexa:  normal adnexa  Rectal Exam: Not performed.       IUD Insertion Procedure Note Patient identified, informed consent performed, consent signed.   Discussed risks of irregular bleeding, cramping, infection, malpositioning or misplacement of the IUD outside the uterus which may require further procedure such as laparoscopy. Time out was performed.  Urine pregnancy test negative.  Speculum placed in the vagina.  Cervix visualized.  Cleaned with Betadine x 2.  Grasped anteriorly with a single tooth tenaculum.  Uterus sounded to 6.5 cm.  Mirena IUD placed per manufacturer's recommendations.  Strings trimmed to 3 cm. Tenaculum was  removed, good hemostasis noted.  Patient tolerated procedure well.   Patient was given post-procedure instructions.  She was advised to have backup contraception for one week.  Patient was also asked to check IUD strings periodically and follow up in 4 weeks for IUD check.   Assessment:    Normal postpartum exam. Pap smear not done at today's visit.   Plan:   1. Contraception: LNG-IUD 2. Mood- WNL, no concerns 3. Infant feeding- BF typically give 2 oz of BM and 2-3oz formula every 3 hours. She is pumping after feeding 4. Chronic medical- none   Follow up in: PRN

## 2017-04-07 ENCOUNTER — Ambulatory Visit (INDEPENDENT_AMBULATORY_CARE_PROVIDER_SITE_OTHER): Payer: Medicaid Other | Admitting: Family Medicine

## 2017-04-07 ENCOUNTER — Encounter: Payer: Self-pay | Admitting: Family Medicine

## 2017-04-07 DIAGNOSIS — Z30430 Encounter for insertion of intrauterine contraceptive device: Secondary | ICD-10-CM

## 2017-04-07 DIAGNOSIS — Z3202 Encounter for pregnancy test, result negative: Secondary | ICD-10-CM

## 2017-04-07 DIAGNOSIS — Z01812 Encounter for preprocedural laboratory examination: Secondary | ICD-10-CM

## 2017-04-07 DIAGNOSIS — Z1389 Encounter for screening for other disorder: Secondary | ICD-10-CM | POA: Diagnosis not present

## 2017-04-07 LAB — POCT URINE PREGNANCY: Preg Test, Ur: NEGATIVE

## 2017-04-09 MED ORDER — LEVONORGESTREL 20 MCG/24HR IU IUD: 1.0000 | INTRAUTERINE_SYSTEM | Freq: Once | INTRAUTERINE | 0 refills | Status: DC

## 2017-04-09 NOTE — Addendum Note (Signed)
Addended by: Gretchen Short on: 04/09/2017 09:14 AM   Modules accepted: Orders

## 2017-05-19 ENCOUNTER — Ambulatory Visit (INDEPENDENT_AMBULATORY_CARE_PROVIDER_SITE_OTHER): Payer: Medicaid Other | Admitting: Family Medicine

## 2017-05-19 VITALS — BP 106/71 | HR 68 | Wt 144.2 lb

## 2017-05-19 DIAGNOSIS — Z30011 Encounter for initial prescription of contraceptive pills: Secondary | ICD-10-CM

## 2017-05-19 DIAGNOSIS — Z30432 Encounter for removal of intrauterine contraceptive device: Secondary | ICD-10-CM | POA: Diagnosis not present

## 2017-05-19 MED ORDER — NORETHINDRONE 0.35 MG PO TABS: 1.0000 | ORAL_TABLET | Freq: Every day | ORAL | 11 refills | Status: DC

## 2017-05-19 NOTE — Progress Notes (Signed)
   GYNECOLOGY CLINIC PROCEDURE NOTE  IUD Removal  Patient identified, informed consent performed, consent signed.  Patient was in the dorsal lithotomy position, normal external genitalia was noted.  A speculum was placed in the patient's vagina, normal discharge was noted, no lesions. The cervix was visualized, no lesions, no abnormal discharge.  The strings of the IUD were grasped and pulled using ring forceps. The IUD was removed in its entirety.  Patient tolerated the procedure well.    Patient will use POP for contraception. Take PNV and folic acid.  Routine preventative health maintenance measures emphasized.    Objective:  Physical Exam Blood pressure 106/71, pulse 68, weight 144 lb 3.2 oz (65.4 kg), currently breastfeeding. Gen: NAD Abd: Soft, nontender and nondistended Pelvic: Normal appearing external genitalia; normal appearing vaginal mucosa and cervix.    Assessment & Plan:  Start POP given patient is breastfeeding Return for Nexplanon if desired.   Caren Macadam, [place] for [employer], [place]

## 2017-05-19 NOTE — Progress Notes (Signed)
Would like to have birth control pills.

## 2017-09-15 ENCOUNTER — Encounter: Payer: Self-pay | Admitting: Radiology

## 2017-09-15 ENCOUNTER — Encounter: Payer: Self-pay | Admitting: Family Medicine

## 2017-09-15 ENCOUNTER — Ambulatory Visit (INDEPENDENT_AMBULATORY_CARE_PROVIDER_SITE_OTHER): Payer: Medicaid Other | Admitting: Family Medicine

## 2017-09-15 VITALS — BP 105/68 | HR 72 | Wt 143.0 lb

## 2017-09-15 DIAGNOSIS — Z23 Encounter for immunization: Secondary | ICD-10-CM | POA: Diagnosis not present

## 2017-09-15 DIAGNOSIS — Z309 Encounter for contraceptive management, unspecified: Secondary | ICD-10-CM

## 2017-09-15 DIAGNOSIS — Z30017 Encounter for initial prescription of implantable subdermal contraceptive: Secondary | ICD-10-CM

## 2017-09-15 MED ORDER — ETONOGESTREL 68 MG ~~LOC~~ IMPL
68.0000 mg | DRUG_IMPLANT | Freq: Once | SUBCUTANEOUS | Status: AC
  Administered 2017-09-15: 68 mg via SUBCUTANEOUS

## 2017-09-15 NOTE — Progress Notes (Signed)
    S: Here for contraceptive method.Taking POPs but forgot pills x2. Reports last unprotected sex 1 week ago (after missing 2 pills). Currently on day 4 of menses.   O: BP 105/68   Pulse 72   Wt 143 lb (64.9 kg)   LMP 09/15/2017   BMI 26.16 kg/m   Gen: well appearing CV:RR Lungs: NWOB  A/P: Patient decided to have nexplanon placed after counseling. Reviewed risk/benefits of procedure. Discussed bleeding as a possible side effect.   GYNECOLOGY CLINIC PROCEDURE NOTE  Tiffany Chavez is a 27 y.o. [id number] here for Nexplanon insertion.  No other gynecologic concerns.  Nexplanon Insertion Procedure Patient identified, informed consent performed, consent signed.   Patient does understand that irregular bleeding is a very common side effect of this medication. She was advised to have backup contraception for one week after placement. Pregnancy test in clinic today was negative.  Appropriate time out taken.  Patient's left arm was prepped and draped in the usual sterile fashion.. The ruler used to measure and mark insertion area.  Patient was prepped with alcohol swab and then injected with 3 ml of 1% lidocaine.  She was prepped with betadine, Nexplanon removed from packaging,  Device confirmed in needle, then inserted full length of needle and withdrawn per handbook instructions. Nexplanon was able to palpated in the patient's arm; patient palpated the insert herself. There was minimal blood loss.  Patient insertion site covered with guaze and a pressure bandage to reduce any bruising.  The patient tolerated the procedure well and was given post procedure instructions.    Caren Macadam, MD, MPH, ABFM Attending [place] for [facility]

## 2018-04-25 ENCOUNTER — Encounter: Payer: Self-pay | Admitting: Obstetrics & Gynecology

## 2018-04-25 ENCOUNTER — Ambulatory Visit (INDEPENDENT_AMBULATORY_CARE_PROVIDER_SITE_OTHER): Payer: Medicaid Other | Admitting: Obstetrics & Gynecology

## 2018-04-25 VITALS — BP 114/79 | HR 72 | Wt 149.0 lb

## 2018-04-25 DIAGNOSIS — Z3009 Encounter for other general counseling and advice on contraception: Secondary | ICD-10-CM | POA: Diagnosis not present

## 2018-04-25 NOTE — Progress Notes (Signed)
   GYNECOLOGY OFFICE VISIT NOTE  History:  27 y.o. [id number] here today for Nexplanon removal. She is undergoing liposuction soon as was advised by her surgeon to have her Nexplanon removed due to concern about thromboembolism risk, and then reinsert it weeks after surgery. She has no issues with the Nexplanon. She denies any abnormal vaginal discharge, bleeding, pelvic pain or other concerns.   Past Medical History:  Diagnosis Date  . Medical history non-contributory     Past Surgical History:  Procedure Laterality Date  . RHINOPLASTY      The following portions of the patient's history were reviewed and updated as appropriate: allergies, current medications, past family history, past medical history, past social history, past surgical history and problem list.   Health Maintenance:  LGSIL pap and negative HRHPV in 05/11/2016.   Review of Systems:  Pertinent items noted in HPI and remainder of comprehensive ROS otherwise negative.  Objective:  Physical Exam BP 114/79   Pulse 72   Wt 149 lb (67.6 kg)   BMI 27.25 kg/m  CONSTITUTIONAL: Well-developed, well-nourished female in no acute distress.  HEENT:  Normocephalic, atraumatic. External right and left ear normal. No scleral icterus.  NECK: Normal range of motion, supple, no masses noted on observation SKIN: Skin is warm and dry. No rash noted. Not diaphoretic. No erythema. No pallor. MUSCULOSKELETAL: Normal range of motion. No edema noted. NEUROLOGIC: Alert and oriented to person, place, and time. Normal muscle tone coordination. No cranial nerve deficit noted. PSYCHIATRIC: Normal mood and affect. Normal behavior. Normal judgment and thought content. CARDIOVASCULAR: Normal heart rate noted RESPIRATORY: Effort and breath sounds normal, no problems with respiration noted ABDOMEN: Soft, no distention noted.   PELVIC: Deferred  Assessment & Plan:  General counseling and advice on female contraception Patient was counseled that it  was not necessary to remove her Nexplanon; Nexplanon contains etonogestrel which is a progestin and does not have a high thrombogenic risk compared to estrogen and other thrombogenic states. However, if her surgeon is concerned, patient can be given low molecular weight heparin (lovenox) or Xarelto 10 mg po daily for VTE prophylaxis in lieu of Nexplanon removal.  Nexplanon is very effective in preventing pregnancy; and pregnancy is a highly thrombogenic state.  Patient was told that a copy of this note can be forwarded to her surgeon; and I am willing to discuss this with them.  Patient agreed with my refusal to remove the Nexplanon today.   Routine preventative health maintenance measures emphasized.She needs to return for pap smear soon, had abnormal pap in 2017.   Total face-to-face time with patient: 15 minutes.  Over 50% of encounter was spent on counseling and coordination of care.   Verita Schneiders, MD, [place] for [employer] [facility], [place]

## 2018-04-25 NOTE — Patient Instructions (Addendum)
MyChart allows you to send messages to your doctor, view your lab results (as released by your physician), manage appointments, and more. To sign in, log on to [email or web address] using the Address Bar in your browser.  If you have questions, you can call [phone] to talk to our [facility] staff. Remember, MyChart is NOT to be used for urgent needs. For medical emergencies, dial 911.

## 2018-07-18 ENCOUNTER — Encounter: Payer: Self-pay | Admitting: Radiology

## 2018-10-12 ENCOUNTER — Ambulatory Visit (INDEPENDENT_AMBULATORY_CARE_PROVIDER_SITE_OTHER): Payer: Medicaid Other | Admitting: Advanced Practice Midwife

## 2018-10-12 ENCOUNTER — Other Ambulatory Visit: Payer: Self-pay

## 2018-10-12 ENCOUNTER — Encounter: Payer: Self-pay | Admitting: Advanced Practice Midwife

## 2018-10-12 ENCOUNTER — Other Ambulatory Visit (HOSPITAL_COMMUNITY)
Admission: RE | Admit: 2018-10-12 | Discharge: 2018-10-12 | Disposition: A | Discharge status: Home / Self Care | Payer: Medicaid Other | Source: Ambulatory Visit | Attending: Advanced Practice Midwife | Admitting: Advanced Practice Midwife

## 2018-10-12 VITALS — BP 110/74 | HR 80 | Wt 142.0 lb

## 2018-10-12 DIAGNOSIS — R87612 Low grade squamous intraepithelial lesion on cytologic smear of cervix (LGSIL): Secondary | ICD-10-CM

## 2018-10-12 DIAGNOSIS — Z01419 Encounter for gynecological examination (general) (routine) without abnormal findings: Secondary | ICD-10-CM | POA: Diagnosis present

## 2018-10-12 DIAGNOSIS — Z0000 Encounter for general adult medical examination without abnormal findings: Secondary | ICD-10-CM

## 2018-10-12 HISTORY — DX: Low grade squamous intraepithelial lesion on cytologic smear of cervix (LGSIL): R87.612

## 2018-10-12 NOTE — Progress Notes (Signed)
Pap 2017-normal

## 2018-10-12 NOTE — Progress Notes (Signed)
GYNECOLOGY ANNUAL PREVENTATIVE CARE ENCOUNTER NOTE  History:     Shukri A Covarrubias [name] is a 28 y.o. G67P2002 female here for a routine annual gynecologic exam.  Current complaints: none.   Denies abnormal vaginal bleeding, discharge, pelvic pain, problems with intercourse or other gynecologic concerns.    Lives with husband and children. Works three days per week, exercises average of two days per week. Denies thoughts of self harm or HI. Denies concern for IPV.  Patient declines STI testing today. She states she has a PCP but has not had lab work performed recently. She requests well woman blood tests today.   Gynecologic History No LMP recorded. Contraception: Nexplanon Last Pap: 05/11/2016. Results were: LSIL  Obstetric History OB History  Gravida Para Term Preterm AB Living  2 2 2  0 0 2  SAB TAB Ectopic Multiple Live Births  0 0 0 0 2    # Outcome Date GA Lbr Len/2nd Weight Sex Delivery Anes PTL Lv  2 Term 03/06/17 [redacted]w[redacted]d 02:08 7 lb 0.2 oz (3.181 kg) F Vag-Spont EPI  LIV  1 Term 12/20/14 109w6d 15:15 / 00:34 7 lb 3 oz (3.26 kg) M Vag-Spont EPI  LIV    Past Medical History:  Diagnosis Date  . Medical history non-contributory     Past Surgical History:  Procedure Laterality Date  . RHINOPLASTY      Current Outpatient Medications on File Prior to Visit  Medication Sig Dispense Refill  . fexofenadine (ALLEGRA) 30 MG tablet Take 30 mg by mouth 2 (two) times daily.    Marland Kitchen ibuprofen (ADVIL,MOTRIN) 600 MG tablet Take 1 tablet (600 mg total) by mouth every 6 (six) hours as needed. (Patient not taking: Reported on 09/15/2017) 30 tablet 0  . norethindrone (MICRONOR,CAMILA,ERRIN) 0.35 MG tablet Take 1 tablet (0.35 mg total) by mouth daily. (Patient not taking: Reported on 04/25/2018) 1 Package 11  . Prenat-FeFum-FePo-FA-Omega 3 (CONCEPT DHA) 53.5-38-1 MG CAPS Take 1 tablet by mouth daily. (Patient not taking: Reported on 04/25/2018) 30 capsule 8   No current  facility-administered medications on file prior to visit.     No Known Allergies  Social History:  reports that she has never smoked. She has never used smokeless tobacco. She reports that she does not drink alcohol or use drugs.  No family history on file.  The following portions of the patient's history were reviewed and updated as appropriate: allergies, current medications, past family history, past medical history, past social history, past surgical history and problem list.  Review of Systems Pertinent items noted in HPI and remainder of comprehensive ROS otherwise negative.  Physical Exam:  BP 110/74   Pulse 80   Wt 142 lb (64.4 kg)   BMI 25.97 kg/m  CONSTITUTIONAL: Well-developed, well-nourished female in no acute distress.  HENT:  Normocephalic, atraumatic, External right and left ear normal. Oropharynx is clear and moist EYES: Conjunctivae and EOM are normal. Pupils are equal, round, and reactive to light. No scleral icterus.  NECK: Normal range of motion, supple, no masses.  Normal thyroid.  SKIN: Skin is warm and dry. No rash noted. Not diaphoretic. No erythema. No pallor. MUSCULOSKELETAL: Normal range of motion. No tenderness.  No cyanosis, clubbing, or edema.  2+ distal pulses. NEUROLOGIC: Alert and oriented to person, place, and time. Normal reflexes, muscle tone coordination. No cranial nerve deficit noted. PSYCHIATRIC: Normal mood and affect. Normal behavior. Normal judgment and thought content. CARDIOVASCULAR: Normal heart rate noted, regular rhythm RESPIRATORY: Clear to  auscultation bilaterally. Effort and breath sounds normal, no problems with respiration noted. BREASTS: Symmetric in size. No masses, skin changes, nipple drainage, or lymphadenopathy. ABDOMEN: Soft, normal bowel sounds, no distention noted.  No tenderness, rebound or guarding.  PELVIC: Normal appearing external genitalia; normal appearing vaginal mucosa and cervix.  No abnormal discharge noted.  Pap  smear obtained.  Normal uterine size, no other palpable masses, no uterine or adnexal tenderness.   Assessment and Plan:    1. Well woman exam with routine gynecological exam - No complaints or concerns - CBC - TSH - Hemoglobin A1c - Comprehensive metabolic panel - Cytology - PAP  2. 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 Routine preventative health maintenance measures emphasized. Please refer to After Visit Summary for other counseling recommendations.     Mallie Snooks, [place] for [employer], [place]

## 2018-10-13 LAB — COMPREHENSIVE METABOLIC PANEL
A/G RATIO: 1.8 (ref 1.2–2.2)
ALT: 14 IU/L (ref 0–32)
AST: 15 IU/L (ref 0–40)
Albumin: 4.8 g/dL (ref 3.9–5.0)
Alkaline Phosphatase: 81 IU/L (ref 39–117)
BUN/Creatinine Ratio: 21 (ref 9–23)
BUN: 11 mg/dL (ref 6–20)
Bilirubin Total: 0.3 mg/dL (ref 0.0–1.2)
CALCIUM: 9.6 mg/dL (ref 8.7–10.2)
CHLORIDE: 102 mmol/L (ref 96–106)
CO2: 23 mmol/L (ref 20–29)
Creatinine, Ser: 0.53 mg/dL — ABNORMAL LOW (ref 0.57–1.00)
GFR, EST AFRICAN AMERICAN: 153 mL/min/{1.73_m2} (ref >59)
GFR, EST NON AFRICAN AMERICAN: 132 mL/min/{1.73_m2} (ref >59)
GLUCOSE: 86 mg/dL (ref 65–99)
Globulin, Total: 2.7 g/dL (ref 1.5–4.5)
POTASSIUM: 4.1 mmol/L (ref 3.5–5.2)
Sodium: 139 mmol/L (ref 134–144)
TOTAL PROTEIN: 7.5 g/dL (ref 6.0–8.5)

## 2018-10-13 LAB — CBC
HEMATOCRIT: 36.4 % (ref 34.0–46.6)
HEMOGLOBIN: 12.6 g/dL (ref 11.1–15.9)
MCH: 29.6 pg (ref 26.6–33.0)
MCHC: 34.6 g/dL (ref 31.5–35.7)
MCV: 85 fL (ref 79–97)
Platelets: 257 10*3/uL (ref 150–450)
RBC: 4.26 x10E6/uL (ref 3.77–5.28)
RDW: 13.6 % (ref 11.7–15.4)
WBC: 6.8 10*3/uL (ref 3.4–10.8)

## 2018-10-13 LAB — HEMOGLOBIN A1C
ESTIMATED AVERAGE GLUCOSE: 111 mg/dL
HEMOGLOBIN A1C: 5.5 % (ref 4.8–5.6)

## 2018-10-13 LAB — TSH: TSH: 2.07 u[IU]/mL (ref 0.450–4.500)

## 2018-10-14 LAB — CYTOLOGY - PAP: DIAGNOSIS: NEGATIVE

## 2019-02-24 ENCOUNTER — Other Ambulatory Visit: Payer: Self-pay

## 2019-02-24 DIAGNOSIS — Z20822 Contact with and (suspected) exposure to covid-19: Secondary | ICD-10-CM

## 2019-02-27 LAB — NOVEL CORONAVIRUS, NAA: SARS-CoV-2, NAA: NOT DETECTED

## 2019-04-25 ENCOUNTER — Other Ambulatory Visit: Payer: Self-pay

## 2019-04-25 DIAGNOSIS — Z20822 Contact with and (suspected) exposure to covid-19: Secondary | ICD-10-CM

## 2019-04-26 LAB — NOVEL CORONAVIRUS, NAA: SARS-CoV-2, NAA: NOT DETECTED

## 2019-05-02 ENCOUNTER — Other Ambulatory Visit: Payer: Self-pay

## 2019-05-02 DIAGNOSIS — Z20822 Contact with and (suspected) exposure to covid-19: Secondary | ICD-10-CM

## 2019-05-03 LAB — NOVEL CORONAVIRUS, NAA: SARS-CoV-2, NAA: NOT DETECTED

## 2019-08-24 ENCOUNTER — Encounter: Payer: Self-pay | Admitting: Radiology

## 2019-09-25 ENCOUNTER — Ambulatory Visit: Payer: Medicaid Other | Attending: Internal Medicine

## 2019-09-25 DIAGNOSIS — Z20822 Contact with and (suspected) exposure to covid-19: Secondary | ICD-10-CM

## 2019-09-26 LAB — NOVEL CORONAVIRUS, NAA: SARS-CoV-2, NAA: NOT DETECTED

## 2019-10-09 ENCOUNTER — Ambulatory Visit: Payer: Medicaid Other | Attending: Internal Medicine

## 2019-10-09 DIAGNOSIS — Z20822 Contact with and (suspected) exposure to covid-19: Secondary | ICD-10-CM

## 2019-10-10 LAB — NOVEL CORONAVIRUS, NAA: SARS-CoV-2, NAA: NOT DETECTED

## 2020-01-24 ENCOUNTER — Encounter: Payer: Self-pay | Admitting: Advanced Practice Midwife

## 2020-01-24 ENCOUNTER — Ambulatory Visit (INDEPENDENT_AMBULATORY_CARE_PROVIDER_SITE_OTHER): Payer: Medicaid Other | Admitting: Advanced Practice Midwife

## 2020-01-24 ENCOUNTER — Other Ambulatory Visit: Payer: Self-pay

## 2020-01-24 VITALS — BP 121/77 | HR 78 | Ht 61.0 in | Wt 150.0 lb

## 2020-01-24 DIAGNOSIS — Z3046 Encounter for surveillance of implantable subdermal contraceptive: Secondary | ICD-10-CM | POA: Diagnosis not present

## 2020-01-24 MED ORDER — FLUCONAZOLE 150 MG PO TABS: 150.0000 mg | ORAL_TABLET | Freq: Once | ORAL | 0 refills | Status: AC

## 2020-01-24 NOTE — Progress Notes (Signed)
     GYNECOLOGY OFFICE PROCEDURE NOTE  Bruchy A Covarrubias [name] is a 29 y.o. [id number] here for Nexplanon removal.  Last pap smear was on 10/12/2018 and was normal.  No other gynecologic concerns.   Nexplanon Removal Patient identified, informed consent performed, consent signed.   Appropriate time out taken. Nexplanon site identified.  Area prepped in usual sterile fashon. One ml of 1% lidocaine was used to anesthetize the area at the distal end of the implant. A small stab incision was made right beside the implant on the distal portion.  The Nexplanon rod was grasped using hemostats and removed without difficulty.  There was minimal blood loss. There were no complications.  3 ml of 1% lidocaine was injected around the incision for post-procedure analgesia.  Steri-strips were applied over the small incision.  A pressure bandage was applied to reduce any bruising.  The patient tolerated the procedure well and was given post procedure instructions.  Patient desires pregnancy.   Mallie Snooks, MSN, CNM Certified Nurse Midwife, [facility] for [employer], [place] 01/24/20 10:19 AM

## 2020-01-24 NOTE — Progress Notes (Signed)
Would like to get nexplanon removed and discuss that she gets frequent yeast infections

## 2020-01-24 NOTE — Patient Instructions (Signed)
Nexplanon Instructions After Removal   Keep bandage clean and dry for 24 hours   May use ice/Tylenol/Ibuprofen for soreness or pain   If you develop fever, drainage or increased warmth from incision site-contact office immediately     Preparing for Pregnancy If you are considering becoming pregnant, make an appointment to see your regular health care provider to learn how to prepare for a safe and healthy pregnancy (preconception care). During a preconception care visit, your health care provider will:  Do a complete physical exam, including a Pap test.  Take a complete medical history.  Give you information, answer your questions, and help you resolve problems. Preconception checklist Medical history  Tell your health care provider about any current or past medical conditions. Your pregnancy or your ability to become pregnant may be affected by chronic conditions, such as diabetes, chronic hypertension, and thyroid problems.  Include your family's medical history as well as your partner's medical history.  Tell your health care provider about any history of STIs (sexually transmitted infections).These can affect your pregnancy. In some cases, they can be passed to your baby. Discuss any concerns that you have about STIs.  If indicated, discuss the benefits of genetic testing. This testing will show whether there are any genetic conditions that may be passed from you or your partner to your baby.  Tell your health care provider about: ? Any problems you have had with conception or pregnancy. ? Any medicines you take. These include vitamins, herbal supplements, and over-the-counter medicines. ? Your history of immunizations. Discuss any vaccinations that you may need. Diet  Ask your health care provider what to include in a healthy diet that has a balance of nutrients. This is especially important when you are pregnant or preparing to become pregnant.  Ask your health care  provider to help you reach a healthy weight before pregnancy. ? If you are overweight, you may be at higher risk for certain complications, such as high blood pressure, diabetes, and preterm birth. ? If you are underweight, you are more likely to have a baby who has a low birth weight. Lifestyle, work, and home  Let your health care provider know: ? About any lifestyle habits that you have, such as alcohol use, drug use, or smoking. ? About recreational activities that may put you at risk during pregnancy, such as downhill skiing and certain exercise programs. ? Tell your health care provider about any international travel, especially any travel to places with an active [place] virus outbreak. ? About harmful substances that you may be exposed to at work or at home. These include chemicals, pesticides, radiation, or even litter boxes. ? If you do not feel safe at home. Mental health  Tell your health care provider about: ? Any history of mental health conditions, including feelings of depression, sadness, or anxiety. ? Any medicines that you take for a mental health condition. These include herbs and supplements. Home instructions to prepare for pregnancy Lifestyle   Eat a balanced diet. This includes fresh fruits and vegetables, whole grains, lean meats, low-fat dairy products, healthy fats, and foods that are high in fiber. Ask to meet with a nutritionist or registered dietitian for assistance with meal planning and goals.  Get regular exercise. Try to be active for at least 30 minutes a day on most days of the week. Ask your health care provider which activities are safe during pregnancy.  Do not use any products that contain nicotine or tobacco, such as  cigarettes and e-cigarettes. If you need help quitting, ask your health care provider.  Do not drink alcohol.  Do not take illegal drugs.  Maintain a healthy weight. Ask your health care provider what weight range is right for  you. General instructions  Keep an accurate record of your menstrual periods. This makes it easier for your health care provider to determine your baby's due date.  Begin taking prenatal vitamins and folic acid supplements daily as directed by your health care provider.  Manage any chronic conditions, such as high blood pressure and diabetes, as told by your health care provider. This is important. How do I know that I am pregnant? You may be pregnant if you have been sexually active and you miss your period. Symptoms of early pregnancy include:  Mild cramping.  Very light vaginal bleeding (spotting).  Feeling unusually tired.  Nausea and vomiting (morning sickness). If you have any of these symptoms and you suspect that you might be pregnant, you can take a home pregnancy test. These tests check for a hormone in your urine (human chorionic gonadotropin, or hCG). A woman's body begins to make this hormone during early pregnancy. These tests are very accurate. Wait until at least the first day after you miss your period to take one. If the test shows that you are pregnant (you get a positive result), call your health care provider to make an appointment for prenatal care. What should I do if I become pregnant?      Make an appointment with your health care provider as soon as you suspect you are pregnant.  Do not use any products that contain nicotine, such as cigarettes, chewing tobacco, and e-cigarettes. If you need help quitting, ask your health care provider.  Do not drink alcoholic beverages. Alcohol is related to a number of birth defects.  Avoid toxic odors and chemicals.  You may continue to have sexual intercourse if it does not cause pain or other problems, such as vaginal bleeding. This information is not intended to replace advice given to you by your health care provider. Make sure you discuss any questions you have with your health care provider. Document Revised:  07/22/2017 Document Reviewed: 02/09/2016 Elsevier Patient Education  [place].

## 2020-06-25 ENCOUNTER — Encounter: Payer: Self-pay | Admitting: Radiology

## 2020-06-25 ENCOUNTER — Other Ambulatory Visit: Payer: Self-pay

## 2020-06-25 ENCOUNTER — Encounter: Payer: Self-pay | Admitting: Obstetrics & Gynecology

## 2020-06-25 ENCOUNTER — Ambulatory Visit (INDEPENDENT_AMBULATORY_CARE_PROVIDER_SITE_OTHER): Payer: Medicaid Other | Admitting: Obstetrics & Gynecology

## 2020-06-25 ENCOUNTER — Other Ambulatory Visit (HOSPITAL_COMMUNITY)
Admission: RE | Admit: 2020-06-25 | Discharge: 2020-06-25 | Disposition: A | Discharge status: Home / Self Care | Payer: Medicaid Other | Source: Ambulatory Visit | Attending: Obstetrics & Gynecology | Admitting: Obstetrics & Gynecology

## 2020-06-25 VITALS — BP 125/68 | HR 96 | Wt 158.0 lb

## 2020-06-25 DIAGNOSIS — Z3A13 13 weeks gestation of pregnancy: Secondary | ICD-10-CM

## 2020-06-25 DIAGNOSIS — O2341 Unspecified infection of urinary tract in pregnancy, first trimester: Secondary | ICD-10-CM

## 2020-06-25 DIAGNOSIS — O0990 Supervision of high risk pregnancy, unspecified, unspecified trimester: Secondary | ICD-10-CM | POA: Insufficient documentation

## 2020-06-25 DIAGNOSIS — Z3480 Encounter for supervision of other normal pregnancy, unspecified trimester: Secondary | ICD-10-CM | POA: Insufficient documentation

## 2020-06-25 DIAGNOSIS — Z3482 Encounter for supervision of other normal pregnancy, second trimester: Secondary | ICD-10-CM

## 2020-06-25 DIAGNOSIS — O2342 Unspecified infection of urinary tract in pregnancy, second trimester: Secondary | ICD-10-CM

## 2020-06-25 DIAGNOSIS — Z3493 Encounter for supervision of normal pregnancy, unspecified, third trimester: Secondary | ICD-10-CM | POA: Insufficient documentation

## 2020-06-25 NOTE — Progress Notes (Signed)
History:   Tiffany Tiffany Tiffany Tiffany is a 29 y.o. G3P2002 at [redacted]w[redacted]d by early ultrasound done today being seen today for her first obstetrical visit.  Her obstetrical history is significant for two term uncomplicated SVDs. Patient does intend to breast feed. Pregnancy history fully reviewed.  Patient reports no complaints.      HISTORY: OB History  Gravida Para Term Preterm AB Living  3 2 2  0 0 2  SAB TAB Ectopic Multiple Live Births  0 0 0 0 2    # Outcome Date GA Lbr Len/2nd Weight Sex Delivery Anes PTL Lv  3 Current           2 Term 03/06/17 [redacted]w[redacted]d 02:08 7 lb 0.2 oz (3.181 kg) F Vag-Spont EPI  LIV     Name: Tiffany Tiffany     Apgar1: 9  Apgar5: 9  1 Term 12/20/14 [redacted]w[redacted]d 15:15 / 00:34 7 lb 3 oz (3.26 kg) M Vag-Spont EPI  LIV     Apgar1: 8  Apgar5: 9    Last pap smear was done 10/12/2018 and was normal  Past Medical History:  Diagnosis Date  . Medical history non-contributory    Past Surgical History:  Procedure Laterality Date  . RHINOPLASTY     History reviewed. No pertinent family history. Social History   Tobacco Use  . Smoking status: Never Smoker  . Smokeless tobacco: Never Used  Vaping Use  . Vaping Use: Never used  Substance Use Topics  . Alcohol use: No    Alcohol/week: 0.0 standard drinks  . Drug use: No   No Known Allergies Current Outpatient Medications on File Prior to Visit  Medication Sig Dispense Refill  . Prenat-FeFum-FePo-FA-Omega 3 (CONCEPT DHA) 53.5-38-1 MG CAPS Take 1 tablet by mouth daily. 30 capsule 8   No current facility-administered medications on file prior to visit.    Review of Systems Pertinent items noted in HPI and remainder of comprehensive ROS otherwise negative.  Physical Exam:   Vitals:   06/25/20 1345  BP: 125/68  Pulse: 96  Weight: 158 lb (71.7 kg)    Bedside Ultrasound for FHR check: Viable intrauterine pregnancy with positive cardiac activity note, fetal heart rate 160 bpm Patient informed  that the ultrasound is considered a limited obstetric ultrasound and is not intended to be a complete ultrasound exam.  Patient also informed that the ultrasound is not being completed with the intent of assessing for fetal or placental anomalies or any pelvic abnormalities.  Explained that the purpose of today's ultrasound is to assess for fetal heart rate.  Patient acknowledges the purpose of the exam and the limitations of the study. See separate note for more details.  General: well-developed, well-nourished female in no acute distress  Breasts:  normal appearance, no masses or tenderness bilaterally  Skin: normal coloration and turgor, no rashes  Neurologic: oriented, normal, negative, normal mood  Extremities: normal strength, tone, and muscle mass, ROM of all joints is normal  HEENT PERRLA, extraocular movement intact and sclera clear, anicteric  Neck supple and no masses  Cardiovascular: regular rate and rhythm  Respiratory:  no respiratory distress, normal breath sounds  Abdomen: soft, non-tender; bowel sounds normal; no masses,  no organomegaly  Pelvic: deferred    Assessment:    Pregnancy: [id number] Patient Active Problem List   Diagnosis Date Noted  . Supervision of other normal pregnancy, antepartum 06/25/2020  . Nexplanon removal 01/24/2020  . Papanicolaou smear of cervix with low grade squamous intraepithelial  lesion (LGSIL) 10/12/2018  . Dermoid cyst of ovary, left 11/19/2016     Plan:    1. [redacted] weeks gestation of pregnancy 2. Supervision of other normal pregnancy, antepartum - CBC/D/Plt+RPR+Rh+ABO+Rub Ab... - Culture, OB Urine - Genetic Screening - GC/Chlamydia probe amp ([place])not at [facility] - [place] MFM OB COMP + 14 WK; Future - Enroll Patient in Babyscripts - Comprehensive metabolic panel - Hemoglobin A1c Initial labs drawn. Continue prenatal vitamins. Problem list reviewed and updated. Genetic Screening discussed, NIPS: ordered. Ultrasound discussed; fetal  anatomic survey: ordered. Anticipatory guidance about prenatal visits given including labs, ultrasounds, and testing. Discussed usage of Babyscripts and virtual visits as additional source of managing and completing prenatal visits in midst of coronavirus and pandemic.   Encouraged to complete MyChart Registration for her ability to review results, send requests, and have questions addressed.  The nature of [place] for [facility] with multiple MDs and Advanced Practice Providers was explained to patient; also emphasized that residents, students are part of our team. Routine obstetric precautions reviewed. Encouraged to seek out care at office or emergency room [facility] preferred) for urgent and/or emergent concerns. Return in about 4 weeks (around 07/23/2020) for OFFICE OB VISIT (MD or APP).     Verita Schneiders, MD, [place] for [employer], [place]

## 2020-06-25 NOTE — Patient Instructions (Addendum)

## 2020-06-25 NOTE — Progress Notes (Signed)
Last period in August unsure of dates  [facility] SONOGRAM   Tiffany Chavez [name] is a 29 y.o. year old G6P2002 with LMP No LMP recorded. Patient is pregnant. which would correlate to  [redacted]w[redacted]d weeks gestation.  She has irregular menstrual cycles.   She is here today for a confirmatory initial sonogram.    GESTATION: SINGLETON yes     FETAL ACTIVITY:          Heart rate         160          The fetus is active.   GESTATIONAL AGE AND  BIOMETRICS:  Gestational criteria: Estimated Date of Delivery: 12/31/20 by early ultrasound now at [redacted]w[redacted]d  Previous Scans:0  GESTATIONAL SAC            mm          weeks  CROWN RUMP LENGTH           6.81cm         13.0 weeks                                                   AVERAGE EGA(BY THIS SCAN):  13.0 weeks  WORKING EDD( early ultrasound ):  12/31/2020     TECHNICIAN COMMENTS: Patient informed that the ultrasound is considered a limited obstetric ultrasound and is not intended to be a complete ultrasound exam. Patient also informed that the ultrasound is not being completed with the intent of assessing for fetal or placental anomalies or any pelvic abnormalities. Explained that the purpose of today's ultrasound is to assess for fetal heart rate. Patient acknowledges the purpose of the exam and the limitations of the study.       A copy of this report including all images has been saved and backed up to a second source for retrieval if needed. All measures and details of the anatomical scan, placentation, fluid volume and pelvic anatomy are contained in that report.  Crosby Oyster 06/25/2020 1:50 PM

## 2020-06-26 LAB — CBC/D/PLT+RPR+RH+ABO+RUB AB...
Antibody Screen: NEGATIVE
Basophils Absolute: 0 10*3/uL (ref 0.0–0.2)
Basos: 0 %
EOS (ABSOLUTE): 0.2 10*3/uL (ref 0.0–0.4)
Eos: 2 %
HCV Ab: <0.1 s/co ratio (ref 0.0–0.9)
HIV Screen 4th Generation wRfx: NONREACTIVE
Hematocrit: 38.1 % (ref 34.0–46.6)
Hemoglobin: 13.1 g/dL (ref 11.1–15.9)
Hepatitis B Surface Ag: NEGATIVE
Immature Grans (Abs): 0 10*3/uL (ref 0.0–0.1)
Immature Granulocytes: 0 %
Lymphocytes Absolute: 1.9 10*3/uL (ref 0.7–3.1)
Lymphs: 18 %
MCH: 29.9 pg (ref 26.6–33.0)
MCHC: 34.4 g/dL (ref 31.5–35.7)
MCV: 87 fL (ref 79–97)
Monocytes Absolute: 0.6 10*3/uL (ref 0.1–0.9)
Monocytes: 6 %
Neutrophils Absolute: 7.7 10*3/uL — ABNORMAL HIGH (ref 1.4–7.0)
Neutrophils: 74 %
Platelets: 248 10*3/uL (ref 150–450)
RBC: 4.38 x10E6/uL (ref 3.77–5.28)
RDW: 12.3 % (ref 11.7–15.4)
RPR Ser Ql: NONREACTIVE
Rh Factor: POSITIVE
Rubella Antibodies, IGG: 1.94 index (ref >0.99)
WBC: 10.4 10*3/uL (ref 3.4–10.8)

## 2020-06-26 LAB — COMPREHENSIVE METABOLIC PANEL
ALT: 21 IU/L (ref 0–32)
AST: 15 IU/L (ref 0–40)
Albumin/Globulin Ratio: 1.7 (ref 1.2–2.2)
Albumin: 4.5 g/dL (ref 3.9–5.0)
Alkaline Phosphatase: 57 IU/L (ref 44–121)
BUN/Creatinine Ratio: 26 — ABNORMAL HIGH (ref 9–23)
BUN: 9 mg/dL (ref 6–20)
Bilirubin Total: <0.2 mg/dL (ref 0.0–1.2)
CO2: 22 mmol/L (ref 20–29)
Calcium: 9.7 mg/dL (ref 8.7–10.2)
Chloride: 100 mmol/L (ref 96–106)
Creatinine, Ser: 0.34 mg/dL — ABNORMAL LOW (ref 0.57–1.00)
GFR calc Af Amer: 174 mL/min/{1.73_m2} (ref >59)
GFR calc non Af Amer: 151 mL/min/{1.73_m2} (ref >59)
Globulin, Total: 2.6 g/dL (ref 1.5–4.5)
Glucose: 111 mg/dL — ABNORMAL HIGH (ref 65–99)
Potassium: 3.8 mmol/L (ref 3.5–5.2)
Sodium: 137 mmol/L (ref 134–144)
Total Protein: 7.1 g/dL (ref 6.0–8.5)

## 2020-06-26 LAB — GC/CHLAMYDIA PROBE AMP (~~LOC~~) NOT AT ARMC
Chlamydia: NEGATIVE
Comment: NEGATIVE
Comment: NORMAL
Neisseria Gonorrhea: NEGATIVE

## 2020-06-26 LAB — HEMOGLOBIN A1C
Est. average glucose Bld gHb Est-mCnc: 111 mg/dL
Hgb A1c MFr Bld: 5.5 % (ref 4.8–5.6)

## 2020-06-26 LAB — HCV INTERPRETATION

## 2020-06-28 LAB — URINE CULTURE, OB REFLEX

## 2020-06-28 LAB — CULTURE, OB URINE

## 2020-06-29 ENCOUNTER — Encounter: Payer: Self-pay | Admitting: Obstetrics & Gynecology

## 2020-06-29 MED ORDER — AMOXICILLIN 500 MG PO CAPS: 500.0000 mg | ORAL_CAPSULE | Freq: Three times a day (TID) | ORAL | 2 refills | Status: DC

## 2020-06-29 NOTE — Addendum Note (Signed)
Addended by: Verita Schneiders A on: 06/29/2020 12:02 PM   Modules accepted: Orders

## 2020-07-02 ENCOUNTER — Encounter: Payer: Self-pay | Admitting: Radiology

## 2020-07-02 ENCOUNTER — Telehealth: Payer: Self-pay | Admitting: Radiology

## 2020-07-02 NOTE — Telephone Encounter (Signed)
Left patient message explaining Panorama results, did not inform of fetal sex, explained that results have been scanned into mychart

## 2020-07-23 ENCOUNTER — Other Ambulatory Visit: Payer: Self-pay

## 2020-07-23 ENCOUNTER — Other Ambulatory Visit (HOSPITAL_COMMUNITY)
Admission: RE | Admit: 2020-07-23 | Discharge: 2020-07-23 | Disposition: A | Discharge status: Home / Self Care | Payer: Medicaid Other | Source: Ambulatory Visit | Attending: Obstetrics & Gynecology | Admitting: Obstetrics & Gynecology

## 2020-07-23 ENCOUNTER — Encounter: Payer: Self-pay | Admitting: Obstetrics & Gynecology

## 2020-07-23 ENCOUNTER — Ambulatory Visit (INDEPENDENT_AMBULATORY_CARE_PROVIDER_SITE_OTHER): Payer: Self-pay | Admitting: Obstetrics & Gynecology

## 2020-07-23 VITALS — BP 128/82 | HR 88 | Wt 157.8 lb

## 2020-07-23 DIAGNOSIS — O23592 Infection of other part of genital tract in pregnancy, second trimester: Secondary | ICD-10-CM | POA: Diagnosis present

## 2020-07-23 DIAGNOSIS — O2342 Unspecified infection of urinary tract in pregnancy, second trimester: Secondary | ICD-10-CM

## 2020-07-23 DIAGNOSIS — O98819 Other maternal infectious and parasitic diseases complicating pregnancy, unspecified trimester: Secondary | ICD-10-CM

## 2020-07-23 DIAGNOSIS — N760 Acute vaginitis: Secondary | ICD-10-CM

## 2020-07-23 DIAGNOSIS — B373 Candidiasis of vulva and vagina: Secondary | ICD-10-CM

## 2020-07-23 DIAGNOSIS — B379 Candidiasis, unspecified: Secondary | ICD-10-CM

## 2020-07-23 DIAGNOSIS — Z3A17 17 weeks gestation of pregnancy: Secondary | ICD-10-CM

## 2020-07-23 DIAGNOSIS — Z3480 Encounter for supervision of other normal pregnancy, unspecified trimester: Secondary | ICD-10-CM

## 2020-07-23 DIAGNOSIS — Z87440 Personal history of urinary (tract) infections: Secondary | ICD-10-CM

## 2020-07-23 NOTE — Patient Instructions (Signed)

## 2020-07-23 NOTE — Progress Notes (Signed)
   PRENATAL VISIT NOTE  Subjective:  Tiffany Chavez is a 29 y.o. G3P2002 at [redacted]w[redacted]d being seen today for ongoing prenatal care.  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  Patient reports vaginal irritation. Reports having frequent yeast infections during pregnancy, usually self-treats with antifungal cream.   Contractions: Not present. Vag. Bleeding: None.   . Denies leaking of fluid.   The following portions of the patient's history were reviewed and updated as appropriate: allergies, current medications, past family history, past medical history, past social history, past surgical history and problem list.   Objective:   Vitals:   07/23/20 0937  BP: 128/82  Pulse: 88  Weight: 157 lb 12.8 oz (71.6 kg)    Fetal Status: Fetal Heart Rate (bpm): 154         General:  Alert, oriented and cooperative. Patient is in no acute distress.  Skin: Skin is warm and dry. No rash noted.   Cardiovascular: Normal heart rate noted  Respiratory: Normal respiratory effort, no problems with respiration noted  Abdomen: Soft, gravid, appropriate for gestational age.  Pain/Pressure: Absent     Pelvic: Cervical exam deferred        Extremities: Normal range of motion.     Mental Status: Normal mood and affect. Normal behavior. Normal judgment and thought content.   Assessment and Plan:  Pregnancy: G3P2002 at [redacted]w[redacted]d 1. Vaginitis during pregnancy, second trimester - Cervicovaginal ancillary testing ordered, will follow up results and manage accordingly.  2. History of UTI Had Enterococcus UTI, treated with Amoxicillin. TOC done today. - Culture, OB Urine  3. [redacted]  weeks gestation of pregnancy 4. Supervision of other normal pregnancy, antepartum Low risk NIPS, already scheduled for anatomy scan. AFP only today.  - AFP, Serum, Open Spina Bifida No other complaints or concerns.  Routine obstetric precautions reviewed.  Please refer to After Visit Summary for other counseling recommendations.   Return in about 4 weeks (around 08/20/2020) for OFFICE OB VISIT (MD or APP).  Future Appointments  Date Time Provider [place]  08/06/2020  9:00 AM [facility] US[facility]    Verita Schneiders, MD

## 2020-07-24 LAB — CERVICOVAGINAL ANCILLARY ONLY
Bacterial Vaginitis (gardnerella): NEGATIVE
Candida Glabrata: POSITIVE — AB
Candida Vaginitis: POSITIVE — AB
Chlamydia: NEGATIVE
Comment: NEGATIVE
Comment: NEGATIVE
Comment: NEGATIVE
Comment: NEGATIVE
Comment: NEGATIVE
Comment: NORMAL
Neisseria Gonorrhea: NEGATIVE
Trichomonas: NEGATIVE

## 2020-07-24 LAB — AFP, SERUM, OPEN SPINA BIFIDA
AFP MoM: 0.76
AFP Value: 28.5 ng/mL
Gest. Age on Collection Date: 17 weeks
Maternal Age At EDD: 27.8 yr
OSBR Risk 1 IN: 10000
Test Results:: NEGATIVE
Weight: 157 [lb_av]

## 2020-07-25 MED ORDER — NYSTATIN 1000000 UNITS PO CAPS: 1.0000 | ORAL_CAPSULE | Freq: Every day | ORAL | 0 refills | Status: DC

## 2020-07-25 MED ORDER — TERCONAZOLE 0.8 % VA CREA: 1.0000 | TOPICAL_CREAM | Freq: Every day | VAGINAL | 0 refills | Status: DC

## 2020-07-25 NOTE — Addendum Note (Signed)
Addended by: Verita Schneiders A on: 07/25/2020 03:40 PM   Modules accepted: Orders

## 2020-07-26 LAB — URINE CULTURE, OB REFLEX

## 2020-07-26 LAB — CULTURE, OB URINE

## 2020-07-30 ENCOUNTER — Telehealth: Payer: Self-pay | Admitting: *Deleted

## 2020-07-30 MED ORDER — AMOXICILLIN 500 MG PO CAPS: 500.0000 mg | ORAL_CAPSULE | Freq: Three times a day (TID) | ORAL | 1 refills | Status: DC

## 2020-07-30 NOTE — Telephone Encounter (Signed)
Pt informed of results and medications sent in. Pt wanted to also let us know that the Pharmacy didn't thave the Nystatin capsules and that they have pill form, will let Dr A know so she can send in another RX.

## 2020-07-30 NOTE — Telephone Encounter (Signed)
Called pharmacy to switch Nystatin, they have vaginal tablets available, ok per Dr A to switch.

## 2020-07-30 NOTE — Progress Notes (Signed)
Repeat urine culture is still positive despite earlier treatment with Amoxicillin.   Second course of Amoxicillin prescribed, this is the best antibiotic for this bacteria (confirmed with ID and Pharmacy).  She needs to be compliant with this therapy. Will recheck urine culture in 4 weeks for test of cure. Please call to inform patient of results and advise her to pick up prescription and take as directed.  Jaynie Collins, MD

## 2020-07-30 NOTE — Addendum Note (Signed)
Addended by: Jaynie Collins A on: 07/30/2020 10:04 AM   Modules accepted: Orders

## 2020-07-30 NOTE — Telephone Encounter (Signed)
-----   Message from Tereso Newcomer, MD sent at 07/30/2020  9:02 AM EST ----- Repeat urine culture is still positive despite earlier treatment with Amoxicillin.   Second course of Amoxicillin prescribed, this is the best antibiotic for this bacteria (confirmed with ID and Pharmacy).  She needs to be compliant with this therapy. Will recheck urine culture in 4 weeks for test of cure. Please call to inform patient of results and advise her to pick up prescription and take as directed.  Jaynie Collins, MD

## 2020-07-31 ENCOUNTER — Encounter: Payer: Self-pay | Admitting: *Deleted

## 2020-07-31 ENCOUNTER — Other Ambulatory Visit: Payer: Self-pay | Admitting: *Deleted

## 2020-07-31 DIAGNOSIS — O98819 Other maternal infectious and parasitic diseases complicating pregnancy, unspecified trimester: Secondary | ICD-10-CM

## 2020-07-31 DIAGNOSIS — B379 Candidiasis, unspecified: Secondary | ICD-10-CM

## 2020-07-31 DIAGNOSIS — B3731 Acute candidiasis of vulva and vagina: Secondary | ICD-10-CM

## 2020-07-31 MED ORDER — NYSTATIN 1000000 UNITS PO CAPS: 1.0000 | ORAL_CAPSULE | Freq: Every day | ORAL | 0 refills | Status: DC

## 2020-07-31 MED ORDER — NYSTATIN 1000000 UNITS PO CAPS: 1.0000 | ORAL_CAPSULE | Freq: Every day | ORAL | 0 refills | Status: AC

## 2020-07-31 NOTE — Progress Notes (Signed)
[facility] do[facility] snot have tablets or capsules available, will send to [facility] instead.

## 2020-08-03 NOTE — L&D Delivery Note (Signed)
Delivery Note At 2:58 PM a viable and healthy female was delivered via Vaginal, Spontaneous (Presentation: Right Occiput Anterior).  APGAR: 9, 9; weight pending.   Placenta status: Spontaneous, Intact.  Cord: 3 vessels with the following complications: none.    Anesthesia: None Episiotomy: None Lacerations: 1st degree;Labial Suture Repair: none Est. Blood Loss (mL): 200  Mom to postpartum.  Baby to Couplet care / Skin to Skin.   Tiffany Chavez is a 30 y.o. female [id number] with IUP at [redacted]w[redacted]d admitted for active labor at term. She presented to MAU with urge to push and was quickly transported by CNM and RN to MAU room.  She stood to transfer positions from wheelchair to the bed and her water broke and she delivered standing.  Infant was brought up to patient's arms and quickly transitioned with vigorous cry.  Patient was assisted into bed where placenta delivered without complication. Cord clamping delayed by several minutes then clamped by CNM and cut by pt significant other.  Intact perineum, tiny right labial tear, hemostatic, not repaired. Mom and baby stable prior to transfer to postpartum. She plans on breastfeeding.   Fatima Blank 01/01/2021, 3:34 PM

## 2020-08-06 ENCOUNTER — Ambulatory Visit: Payer: Medicaid Other | Attending: Obstetrics & Gynecology

## 2020-08-06 ENCOUNTER — Other Ambulatory Visit: Payer: Self-pay

## 2020-08-06 ENCOUNTER — Other Ambulatory Visit: Payer: Self-pay | Admitting: *Deleted

## 2020-08-06 ENCOUNTER — Other Ambulatory Visit: Payer: Self-pay | Admitting: Obstetrics & Gynecology

## 2020-08-06 DIAGNOSIS — O321XX0 Maternal care for breech presentation, not applicable or unspecified: Secondary | ICD-10-CM

## 2020-08-06 DIAGNOSIS — Z362 Encounter for other antenatal screening follow-up: Secondary | ICD-10-CM

## 2020-08-06 DIAGNOSIS — Z3A13 13 weeks gestation of pregnancy: Secondary | ICD-10-CM | POA: Diagnosis present

## 2020-08-06 DIAGNOSIS — Z3480 Encounter for supervision of other normal pregnancy, unspecified trimester: Secondary | ICD-10-CM

## 2020-08-06 DIAGNOSIS — Z363 Encounter for antenatal screening for malformations: Secondary | ICD-10-CM

## 2020-08-06 DIAGNOSIS — O358XX0 Maternal care for other (suspected) fetal abnormality and damage, not applicable or unspecified: Secondary | ICD-10-CM

## 2020-08-06 DIAGNOSIS — Z3687 Encounter for antenatal screening for uncertain dates: Secondary | ICD-10-CM

## 2020-08-08 ENCOUNTER — Encounter: Payer: Self-pay | Admitting: Obstetrics & Gynecology

## 2020-08-08 ENCOUNTER — Telehealth: Payer: Self-pay

## 2020-08-08 DIAGNOSIS — O283 Abnormal ultrasonic finding on antenatal screening of mother: Secondary | ICD-10-CM | POA: Insufficient documentation

## 2020-08-08 NOTE — Telephone Encounter (Signed)
FAXED TO [facility]-[phone]  RECEIVED CONFIRMATION -SUCCESS

## 2020-08-20 ENCOUNTER — Encounter: Payer: Medicaid Other | Admitting: Obstetrics & Gynecology

## 2020-08-27 ENCOUNTER — Encounter: Payer: Self-pay | Admitting: Obstetrics and Gynecology

## 2020-08-27 ENCOUNTER — Telehealth: Payer: Self-pay

## 2020-08-27 ENCOUNTER — Other Ambulatory Visit: Payer: Self-pay

## 2020-08-27 ENCOUNTER — Ambulatory Visit (INDEPENDENT_AMBULATORY_CARE_PROVIDER_SITE_OTHER): Payer: Medicaid Other | Admitting: Obstetrics and Gynecology

## 2020-08-27 VITALS — BP 116/72 | HR 89 | Wt 160.0 lb

## 2020-08-27 DIAGNOSIS — O283 Abnormal ultrasonic finding on antenatal screening of mother: Secondary | ICD-10-CM

## 2020-08-27 DIAGNOSIS — Z3A21 21 weeks gestation of pregnancy: Secondary | ICD-10-CM

## 2020-08-27 DIAGNOSIS — O0992 Supervision of high risk pregnancy, unspecified, second trimester: Secondary | ICD-10-CM

## 2020-08-27 DIAGNOSIS — O43199 Other malformation of placenta, unspecified trimester: Secondary | ICD-10-CM | POA: Insufficient documentation

## 2020-08-27 DIAGNOSIS — O2341 Unspecified infection of urinary tract in pregnancy, first trimester: Secondary | ICD-10-CM

## 2020-08-27 NOTE — Progress Notes (Signed)
Prenatal Visit Note Date: 08/27/2020 Clinic: [facility]  Subjective:  Earlena A Covarrubias [name] is a 30 y.o. G3P2002 at [redacted]w[redacted]d being seen today for ongoing prenatal care.  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  Patient reports no complaints.   Contractions: Not present. Vag. Bleeding: None.  Movement: Present. Denies leaking of fluid.   The following portions of the patient's history were reviewed and updated as appropriate: allergies, current medications, past family history, past medical history, past social history, past surgical history and problem list. Problem list updated.  Objective:   Vitals:   08/27/20 1337  BP: 116/72  Pulse: 89  Weight: 160 lb (72.6 kg)    Fetal Status: Fetal Heart Rate (bpm): 155   Movement: Present     General:  Alert, oriented and cooperative. Patient is in no acute distress.  Skin: Skin is warm and dry. No rash noted.   Cardiovascular: Normal heart rate noted  Respiratory: Normal respiratory effort, no problems with respiration noted  Abdomen: Soft, gravid, appropriate for gestational age. Pain/Pressure: Absent     Pelvic:  Cervical exam deferred        Extremities: Normal range of motion.  Edema: None  Mental Status: Normal mood and affect. Normal behavior. Normal judgment and thought content.   Urinalysis:      Assessment and Plan:  Pregnancy: G3P2002 at [redacted]w[redacted]d  1. Marginal insertion of umbilical cord affecting management of mother Serial growth u/s already scheduled. Consider delivery at 39wks  2. Abnormal fetal  ultrasound with enlarged right atrium Has [facility] echo in a week or two.  3. Supervision of high risk pregnancy in second trimester  4. [redacted] weeks gestation of pregnancy  5. Enterococcus UTI (urinary tract infection) in pregnancy in first trimester toc today - Culture, OB Urine  Preterm labor symptoms and general obstetric precautions including but not limited to vaginal bleeding, contractions, leaking of fluid and fetal movement were reviewed in detail with the patient. Please refer to After Visit Summary for other counseling recommendations.  Return in about 1 month (around 09/27/2020) for in person, md or app.   Aletha Halim, MD

## 2020-08-27 NOTE — Telephone Encounter (Signed)
Pt states she will call to make next appt after she receives new insurance. Needs MD appt in 4 weeks.

## 2020-08-31 LAB — URINE CULTURE, OB REFLEX

## 2020-08-31 LAB — CULTURE, OB URINE

## 2020-09-01 MED ORDER — NITROFURANTOIN MONOHYD MACRO 100 MG PO CAPS: ORAL_CAPSULE | ORAL | 0 refills | Status: DC

## 2020-09-01 NOTE — Addendum Note (Signed)
Addended by: Aletha Halim on: 09/01/2020 11:55 PM   Modules accepted: Orders

## 2020-09-02 ENCOUNTER — Telehealth: Payer: Self-pay | Admitting: Radiology

## 2020-09-02 NOTE — Telephone Encounter (Signed)
Left voice mail for patient to call [facility] to scheduled next OB appointment, last seen 08/27/20

## 2020-09-03 ENCOUNTER — Ambulatory Visit: Payer: Medicaid Other

## 2020-09-03 ENCOUNTER — Encounter: Payer: Self-pay | Admitting: Radiology

## 2020-09-03 ENCOUNTER — Ambulatory Visit: Payer: Medicaid Other | Attending: Obstetrics and Gynecology

## 2020-09-04 ENCOUNTER — Telehealth: Payer: Self-pay | Admitting: Radiology

## 2020-09-04 NOTE — Telephone Encounter (Signed)
Called and left patient a message to call [facility] to scheduled 4 week ROB from 08/27/20.

## 2020-09-18 ENCOUNTER — Telehealth: Payer: Self-pay | Admitting: *Deleted

## 2020-09-18 NOTE — Telephone Encounter (Signed)
Called pt to make sure she did see her urine culture results and did take there medication that was prescribed. Pt verbalizes that she did and did take the medication. Dr Ilda Basset notified.

## 2020-09-18 NOTE — Telephone Encounter (Signed)
-----   Message from Aletha Halim, MD sent at 09/18/2020  2:55 PM EST ----- I got a message from epic that she hasn't seen the mychart message that she has a UTI and abx were sent in for her. Can you touch base with her and see if she saw this and picked up the meds? thanks

## 2020-10-04 ENCOUNTER — Telehealth: Payer: Self-pay

## 2020-10-04 NOTE — Telephone Encounter (Signed)
Patient called and wanted to push her FU out a week - rescheduled for 10/15/2020.

## 2020-10-08 ENCOUNTER — Ambulatory Visit: Payer: Medicaid Other

## 2020-10-08 ENCOUNTER — Ambulatory Visit (INDEPENDENT_AMBULATORY_CARE_PROVIDER_SITE_OTHER): Payer: Medicaid Other | Admitting: Obstetrics and Gynecology

## 2020-10-08 ENCOUNTER — Other Ambulatory Visit: Payer: Self-pay

## 2020-10-08 VITALS — BP 123/66 | HR 101 | Wt 166.8 lb

## 2020-10-08 DIAGNOSIS — O43199 Other malformation of placenta, unspecified trimester: Secondary | ICD-10-CM

## 2020-10-08 DIAGNOSIS — Z3A27 27 weeks gestation of pregnancy: Secondary | ICD-10-CM

## 2020-10-08 DIAGNOSIS — O0992 Supervision of high risk pregnancy, unspecified, second trimester: Secondary | ICD-10-CM

## 2020-10-08 DIAGNOSIS — O2341 Unspecified infection of urinary tract in pregnancy, first trimester: Secondary | ICD-10-CM

## 2020-10-08 DIAGNOSIS — O283 Abnormal ultrasonic finding on antenatal screening of mother: Secondary | ICD-10-CM

## 2020-10-08 NOTE — Progress Notes (Signed)
Pt has no concerns at this time. 

## 2020-10-08 NOTE — Progress Notes (Signed)
Prenatal Visit Note Date: 10/08/2020 Clinic: [facility]  Subjective:  Tiffany Chavez is a 30 y.o. G3P2002 at [redacted]w[redacted]d being seen today for ongoing prenatal care.  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  Patient reports no complaints.   Contractions: Not present. Vag. Bleeding: None.  Movement: Present. Denies leaking of fluid.   The following portions of the patient's history were reviewed and updated as appropriate: allergies, current medications, past family history, past medical history, past social history, past surgical history and problem list. Problem list updated.  Objective:   Vitals:   10/08/20 0840  BP: 123/66  Pulse: (!) 101  Weight: 166 lb 12.8 oz (75.7 kg)    Fetal Status: Fetal Heart Rate (bpm): 148   Movement: Present     General:  Alert, oriented and cooperative. Patient is in no acute distress.  Skin: Skin is warm and dry. No rash noted.   Cardiovascular: Normal heart rate noted  Respiratory: Normal respiratory effort, no problems with respiration noted  Abdomen: Soft, gravid, appropriate for gestational age. Pain/Pressure: Absent     Pelvic:  Cervical exam deferred        Extremities: Normal range of motion.  Edema: None  Mental Status: Normal mood and affect. Normal behavior. Normal judgment and thought content.   Urinalysis:      Assessment and Plan:  Pregnancy: G3P2002 at [redacted]w[redacted]d  1. Supervision of high risk pregnancy in second trimester Routine care - Glucose Tolerance, 2 Hours w/1 Hour - CBC - RPR - HIV Antibody (routine  testing w rflx)  2. [redacted] weeks gestation of pregnancy  3. Enterococcus UTI (urinary tract infection) in pregnancy in first trimester toc nv  4. Marginal insertion of umbilical cord affecting management of mother qmonth growth, consider 39wk IOL  5. Abnormal fetal ultrasound Normal peds cards echo early Feb 2022; pt will need neonatal echo prior to d/c to home.   Preterm labor symptoms and general obstetric precautions including but not limited to vaginal bleeding, contractions, leaking of fluid and fetal movement were reviewed in detail with the patient. Please refer to After Visit Summary for other counseling recommendations.  No follow-ups on file.   Aletha Halim, MD

## 2020-10-09 LAB — GLUCOSE TOLERANCE, 2 HOURS W/ 1HR
Glucose, 1 hour: 140 mg/dL (ref 65–179)
Glucose, 2 hour: 95 mg/dL (ref 65–152)
Glucose, Fasting: 77 mg/dL (ref 65–91)

## 2020-10-09 LAB — CBC
Hematocrit: 35.1 % (ref 34.0–46.6)
Hemoglobin: 11.8 g/dL (ref 11.1–15.9)
MCH: 29 pg (ref 26.6–33.0)
MCHC: 33.6 g/dL (ref 31.5–35.7)
MCV: 86 fL (ref 79–97)
Platelets: 248 10*3/uL (ref 150–450)
RBC: 4.07 x10E6/uL (ref 3.77–5.28)
RDW: 12.7 % (ref 11.7–15.4)
WBC: 10.8 10*3/uL (ref 3.4–10.8)

## 2020-10-09 LAB — HIV ANTIBODY (ROUTINE TESTING W REFLEX): HIV Screen 4th Generation wRfx: NONREACTIVE

## 2020-10-09 LAB — RPR: RPR Ser Ql: NONREACTIVE

## 2020-10-15 ENCOUNTER — Ambulatory Visit: Payer: Medicaid Other | Attending: Obstetrics and Gynecology | Admitting: *Deleted

## 2020-10-15 ENCOUNTER — Ambulatory Visit (HOSPITAL_BASED_OUTPATIENT_CLINIC_OR_DEPARTMENT_OTHER): Payer: Medicaid Other

## 2020-10-15 ENCOUNTER — Encounter: Payer: Self-pay | Admitting: *Deleted

## 2020-10-15 ENCOUNTER — Other Ambulatory Visit: Payer: Self-pay | Admitting: Obstetrics and Gynecology

## 2020-10-15 ENCOUNTER — Other Ambulatory Visit: Payer: Self-pay

## 2020-10-15 DIAGNOSIS — O289 Unspecified abnormal findings on antenatal screening of mother: Secondary | ICD-10-CM

## 2020-10-15 DIAGNOSIS — O0990 Supervision of high risk pregnancy, unspecified, unspecified trimester: Secondary | ICD-10-CM

## 2020-10-15 DIAGNOSIS — Z363 Encounter for antenatal screening for malformations: Secondary | ICD-10-CM

## 2020-10-15 DIAGNOSIS — O283 Abnormal ultrasonic finding on antenatal screening of mother: Secondary | ICD-10-CM

## 2020-10-15 DIAGNOSIS — Z3A28 28 weeks gestation of pregnancy: Secondary | ICD-10-CM | POA: Insufficient documentation

## 2020-10-22 ENCOUNTER — Ambulatory Visit (INDEPENDENT_AMBULATORY_CARE_PROVIDER_SITE_OTHER): Payer: Medicaid Other | Admitting: Obstetrics & Gynecology

## 2020-10-22 ENCOUNTER — Other Ambulatory Visit: Payer: Self-pay

## 2020-10-22 ENCOUNTER — Encounter: Payer: Self-pay | Admitting: Obstetrics & Gynecology

## 2020-10-22 VITALS — BP 111/75 | HR 96 | Wt 166.4 lb

## 2020-10-22 DIAGNOSIS — Z3A29 29 weeks gestation of pregnancy: Secondary | ICD-10-CM

## 2020-10-22 DIAGNOSIS — O2340 Unspecified infection of urinary tract in pregnancy, unspecified trimester: Secondary | ICD-10-CM

## 2020-10-22 DIAGNOSIS — O0990 Supervision of high risk pregnancy, unspecified, unspecified trimester: Secondary | ICD-10-CM

## 2020-10-22 NOTE — Patient Instructions (Signed)
Return to office for any scheduled appointments. Call the office or go to the MAU at [facility] if:  You begin to have strong, frequent contractions  Your water breaks.  Sometimes it is a big gush of fluid, sometimes it is just a trickle that keeps getting your panties wet or running down your legs  You have vaginal bleeding.  It is normal to have a small amount of spotting if your cervix was checked.   You do not feel your baby moving like normal.  If you do not, get something to eat and drink and lay down and focus on feeling your baby move.   If your baby is still not moving like normal, you should call the office or go to MAU.  Any other obstetric concerns.   

## 2020-10-22 NOTE — Progress Notes (Signed)
PRENATAL VISIT NOTE  Subjective:  Tiffany Tiffany Tiffany Tiffany is Chavez 30 y.o. G3P2002 at [redacted]w[redacted]d being seen today for ongoing prenatal care.  She is currently monitored for the following issues for this low-risk pregnancy and has Enterococcus UTI (urinary tract infection) in pregnancy in first and second trimesters and Supervision of high risk pregnancy, antepartum on their problem list.  Patient reports no complaints.  Contractions: Not present. Vag. Bleeding: None.  Movement: Present. Denies leaking of fluid.   The following portions of the patient's history were reviewed and updated as appropriate: allergies, current medications, past family history, past medical history, past social history, past surgical history and problem list.   Objective:   Vitals:   10/22/20 1344  BP: 111/75  Pulse: 96  Weight: 166 lb 6.4 oz (75.5 kg)    Fetal Status: Fetal Heart Rate (bpm): 147 Fundal Height: 29 cm Movement: Present     General:  Alert, oriented and cooperative. Patient is in no acute distress.  Skin: Skin is warm and dry. No rash noted.   Cardiovascular: Normal heart rate noted  Respiratory: Normal respiratory effort, no problems with respiration noted  Abdomen: Soft, gravid, appropriate for gestational age.  Pain/Pressure: Absent     Pelvic: Cervical exam deferred        Extremities: Normal range of motion.  Edema: None  Mental Status: Normal mood and affect. Normal behavior. Normal judgment and thought content.   Imaging: [place] MFM OB FOLLOW UP  Result Date: 10/15/2020 ----------------------------------------------------------------------  OBSTETRICS REPORT                       (Signed Final 10/15/2020 09:11 am) ---------------------------------------------------------------------- Patient Info  ID #:       [id number]                          D.O.B.:  Nov 27, 1990 (29 yrs)  Name:       Tiffany Tiffany                      Visit Date: 10/15/2020 08:24 am              Tiffany Tiffany  ---------------------------------------------------------------------- Performed By  Attending:        Tama High MD        Ref. Address:     [place]                                                             [place], [place]  Performed By:     Polo Riley        [facility]  Referred By:      Osborne Oman MD ---------------------------------------------------------------------- Orders  #  Description                           Code        Ordered By  1  [place] MFM OB FOLLOW UP                   [id number]    Tama High ----------------------------------------------------------------------  #  Order #                     Accession #                Episode #  1  [id number]                   [id number]                 [id number] ---------------------------------------------------------------------- Indications  [redacted] weeks gestation of pregnancy                Z3A.28  Abnormal fetal ultrasound                      O28.9 ---------------------------------------------------------------------- Fetal Evaluation  Num Of Fetuses:         1  Fetal Heart Rate(bpm):  126  Cardiac Activity:       Observed  Presentation:           Cephalic  Placenta:               Posterior Fundal  P. Cord Insertion:      Visualized, central  AFI Sum(cm)     %Tile       Largest Pocket(cm)  14.59           50          4.81  RUQ(cm)       RLQ(cm)       LUQ(cm)        LLQ(cm)  4.81  2.77          3.6            3.41 ---------------------------------------------------------------------- Biometry  BPD:      72.9  mm     G. Age:  29w 2d         60  %     CI:        72.23   %    70 - 86                                                          FL/HC:      20.2   %    19.6 - 20.8  HC:      272.9  mm     G. Age:  29w 6d         56  %    HC/AC:      1.12        0.99 - 1.21  AC:      242.6  mm     G. Age:  28w 4d         41  %    FL/BPD:     75.6   %    71 - 87  FL:       55.1  mm     G. Age:  29w 0d         49  %    FL/AC:      22.7   %    20 - 24  LV:        3.9  mm  Est. FW:    1299  gm    2 lb 14 oz      49  % ---------------------------------------------------------------------- OB History  Gravidity:    3         Term:   2        Prem:   0        SAB:   0  TOP:          0       Ectopic:  0        Living: 2 ---------------------------------------------------------------------- Gestational Age  U/S Today:     29w 1d                                        EDD:   12/30/20  Best:          28w 4d     Det. By:  U/S  (08/06/20)          EDD:   01/03/21 ---------------------------------------------------------------------- Anatomy  Ventricles:            Appears normal         Stomach:                Appears normal  Face:                  Profile appears        Kidneys:                Appear normal  normal  Heart:                 Appears normal         Bladder:                Appears normal  Diaphragm:             Appears normal  Other:  All other anatomy previously seen as normal. This completes the          anatomic survey. ---------------------------------------------------------------------- Impression  Patient with suspected fetal cardiac anomaly (right atrial  dilation) had fetal echocardiography that was reported as  normal.  Fetal growth is appropriate for gestational age. Amniotic fluid  is normal.  Fetal anatomical survey was completed and  appears normal.  Placental cord insertion appears normal (no  evidence of marginal cord insertion).  She does not have gestational diabetes.  Her blood pressure  today at her office is 112/70 mmHg.  ---------------------------------------------------------------------- Recommendations  Follow-up scans as clinically indicated. ----------------------------------------------------------------------                  Tama High, MD Electronically Signed Final Report   10/15/2020 09:11 am ----------------------------------------------------------------------  Labs: Results for orders placed or performed in visit on 10/08/20 (from the past 672 hour(s))  Glucose Tolerance, 2 Hours w/1 Hour   Collection Time: 10/08/20  9:45 AM  Result Value Ref Range   Glucose, Fasting 77 65 - 91 mg/dL   Glucose, 1 hour 140 65 - 179 mg/dL   Glucose, 2 hour 95 65 - 152 mg/dL  CBC   Collection Time: 10/08/20  9:45 AM  Result Value Ref Range   WBC 10.8 3.4 - 10.8 x10E3/uL   RBC 4.07 3.77 - 5.28 x10E6/uL   Hemoglobin 11.8 11.1 - 15.9 g/dL   Hematocrit 35.1 34.0 - 46.6 %   MCV 86 79 - 97 fL   MCH 29.0 26.6 - 33.0 pg   MCHC 33.6 31.5 - 35.7 g/dL   RDW 12.7 11.7 - 15.4 %   Platelets 248 150 - 450 x10E3/uL  RPR   Collection Time: 10/08/20  9:45 AM  Result Value Ref Range   RPR Ser Ql Non Reactive Non Reactive  HIV Antibody (routine testing w rflx)   Collection Time: 10/08/20  9:45 AM  Result Value Ref Range   HIV Screen 4th Generation wRfx Non Reactive Non Reactive    Assessment and Plan:  Pregnancy: G3P2002 at [redacted]w[redacted]d 1. Enterococcus UTI (urinary tract infection) in pregnancy in first and second trimesters Test of cure done today, will follow up results and manage accordingly. - Culture, OB Urine  2. [redacted] weeks gestation of pregnancy 3. Supervision of high risk pregnancy, antepartum Normal labs and ultrasound results reviewed with patient. Patient interested in virtual visits, given BP cuff. Will need to come in for any concerns or around 36 weeks for pelvic cultures, presentation check.  Preterm labor symptoms and general obstetric precautions including but not limited to vaginal bleeding, contractions,  leaking of fluid and fetal movement were reviewed in detail with the patient. Please refer to After Visit Summary for other counseling recommendations.   Return in about 2 weeks (around 11/05/2020) for OFFICE OB VISIT (MD only) -- can be virtual if patient requests and can take BP at home..  Future Appointments  Date Time Provider [place]  11/05/2020  8:45 AM Harolyn Rutherford, Sallyanne Havers, MD [facility]  11/19/2020  8:15 AM Donnamae Jude, MD [facility]  12/03/2020  8:45 AM Constant, Vickii Chafe, MD [facility]  12/10/2020  8:30 AM Cailen Texeira, Sallyanne Havers, MD [facility]  12/17/2020  8:30 AM Takiera Mayo, Sallyanne Havers, MD [facility]  12/24/2020  8:30 AM Devlin Brink, Sallyanne Havers, MD [facility]    Verita Schneiders, MD

## 2020-11-05 ENCOUNTER — Encounter: Payer: Medicaid Other | Admitting: Obstetrics & Gynecology

## 2020-11-19 ENCOUNTER — Other Ambulatory Visit: Payer: Self-pay

## 2020-11-19 ENCOUNTER — Telehealth (INDEPENDENT_AMBULATORY_CARE_PROVIDER_SITE_OTHER): Payer: Medicaid Other | Admitting: Family Medicine

## 2020-11-19 DIAGNOSIS — O0993 Supervision of high risk pregnancy, unspecified, third trimester: Secondary | ICD-10-CM

## 2020-11-19 DIAGNOSIS — O0990 Supervision of high risk pregnancy, unspecified, unspecified trimester: Secondary | ICD-10-CM

## 2020-11-19 DIAGNOSIS — Z3A33 33 weeks gestation of pregnancy: Secondary | ICD-10-CM

## 2020-11-19 NOTE — Progress Notes (Signed)
   OBSTETRICS PRENATAL VIRTUAL VISIT ENCOUNTER NOTE  Provider location: [facility]   Patient location: Home  I connected with Tiffany Chavez on 11/19/20 at  8:15 AM EDT by MyChart Video Encounter and verified that I am speaking with the correct person using two identifiers. I discussed the limitations, risks, security and privacy concerns of performing an evaluation and management service virtually and the availability of in person appointments. I also discussed with the patient that there may be a patient responsible charge related to this service. The patient expressed understanding and agreed to proceed. Subjective:  Tiffany Chavez is a 30 y.o. G3P2002 at [redacted]w[redacted]d being seen today for ongoing prenatal care.  She is currently monitored for the following issues for this low-risk pregnancy and has Enterococcus UTI (urinary tract infection) in pregnancy in first and second trimesters and Supervision of high risk pregnancy, antepartum on their problem list.  Patient reports no complaints.  Contractions: Not present. Vag. Bleeding: None.  Movement: Present. Denies any leaking of fluid.   The following portions of the patient's history were reviewed and updated as appropriate: allergies, current medications, past family history, past medical history, past social history, past surgical history and problem list.   Objective:  There were no vitals filed for this visit.  Fetal Status:     Movement: Present     General:  Alert, oriented and cooperative. Patient is in no acute distress.  Respiratory: Normal respiratory effort, no problems with respiration noted  Mental Status: Normal mood and affect. Normal behavior. Normal judgment and thought content.  Rest of physical exam deferred due to type of encounter  Imaging: No results found.  Assessment and Plan:  Pregnancy: G3P2002 at [redacted]w[redacted]d 1. Supervision of high risk pregnancy,  antepartum Continue routine prenatal care.   Preterm labor symptoms and general obstetric precautions including but not limited to vaginal bleeding, contractions, leaking of fluid and fetal movement were reviewed in detail with the patient. I discussed the assessment and treatment plan with the patient. The patient was provided an opportunity to ask questions and all were answered. The patient agreed with the plan and demonstrated an understanding of the instructions. The patient was advised to call back or seek an in-person office evaluation/go to MAU at [facility] for any urgent or concerning symptoms. Please refer to After Visit Summary for other counseling recommendations.   I provided 7 minutes of face-to-face time during this encounter.  Return in 2 weeks (on 12/03/2020).  Future Appointments  Date Time Provider [place]  12/03/2020  8:45 AM Constant, Vickii Chafe, MD [facility]  12/10/2020  8:30 AM Anyanwu, Sallyanne Havers, MD [facility]  12/17/2020  8:30 AM Anyanwu, Sallyanne Havers, MD [facility]  12/24/2020  8:30 AM Anyanwu, Sallyanne Havers, MD [facility]    Donnamae Jude, MD Center for [facility], [place]

## 2020-11-19 NOTE — Patient Instructions (Signed)

## 2020-11-19 NOTE — Progress Notes (Signed)
I connected with  Tiffany Chavez on 11/19/20 at  8:15 AM EDT by telephone and verified that I am speaking with the correct person using two identifiers.   I discussed the limitations, risks, security and privacy concerns of performing an evaluation and management service by telephone and the availability of in person appointments. I also discussed with the patient that there may be a patient responsible charge related to this service. The patient expressed understanding and agreed to proceed.  Crosby Oyster, RN 11/19/2020  8:10 AM

## 2020-12-03 ENCOUNTER — Ambulatory Visit (INDEPENDENT_AMBULATORY_CARE_PROVIDER_SITE_OTHER): Payer: Medicaid Other | Admitting: Obstetrics and Gynecology

## 2020-12-03 ENCOUNTER — Encounter: Payer: Self-pay | Admitting: Obstetrics and Gynecology

## 2020-12-03 ENCOUNTER — Other Ambulatory Visit: Payer: Self-pay

## 2020-12-03 VITALS — BP 122/77 | HR 89 | Wt 172.4 lb

## 2020-12-03 DIAGNOSIS — O0990 Supervision of high risk pregnancy, unspecified, unspecified trimester: Secondary | ICD-10-CM

## 2020-12-03 NOTE — Progress Notes (Signed)
   PRENATAL VISIT NOTE  Subjective:  Tiffany Chavez is a 30 y.o. G3P2002 at [redacted]w[redacted]d being seen today for ongoing prenatal care.  She is currently monitored for the following issues for this low-risk pregnancy and has Enterococcus UTI (urinary tract infection) in pregnancy in first and second trimesters and Supervision of high risk pregnancy, antepartum on their problem list.  Patient reports no complaints.  Contractions: Not present. Vag. Bleeding: None.  Movement: Present. Denies leaking of fluid.   The following portions of the patient's history were reviewed and updated as appropriate: allergies, current medications, past family history, past medical history, past social history, past surgical history and problem list.   Objective:   Vitals:   12/03/20 0844  BP: 122/77  Pulse: 89  Weight: 172 lb 6.4 oz (78.2 kg)    Fetal Status: Fetal Heart Rate (bpm): 126 Fundal Height: 35 cm Movement: Present     General:  Alert, oriented and cooperative. Patient is in no acute distress.  Skin: Skin is warm and dry. No rash noted.   Cardiovascular: Normal heart rate noted  Respiratory: Normal respiratory effort, no problems with respiration noted  Abdomen: Soft, gravid, appropriate for gestational age.  Pain/Pressure: Absent     Pelvic: Cervical exam deferred        Extremities: Normal range of motion.  Edema: None  Mental Status: Normal mood and affect. Normal behavior. Normal judgment and thought content.   Assessment and Plan:  Pregnancy: G3P2002 at [redacted]w[redacted]d 1. Supervision of high risk pregnancy, antepartum Patient is doing well without complaints Undecided on Tdap Plans Nexplanon for contraception Cultures next visit  Preterm labor symptoms and general obstetric precautions including but not limited to vaginal bleeding, contractions, leaking of fluid and fetal movement were reviewed in detail with the patient. Please refer to After Visit Summary for other counseling  recommendations.   Return in about 1 week (around 12/10/2020) for ROB, Low risk, in person, cultures.  Future Appointments  Date Time Provider [place]  12/10/2020  8:30 AM Anyanwu, Sallyanne Havers, MD [facility]  12/17/2020  8:30 AM Anyanwu, Sallyanne Havers, MD [facility]  12/24/2020  8:30 AM Anyanwu, Sallyanne Havers, MD [facility]    Mora Bellman, MD

## 2020-12-10 ENCOUNTER — Other Ambulatory Visit: Payer: Self-pay

## 2020-12-10 ENCOUNTER — Other Ambulatory Visit (HOSPITAL_COMMUNITY)
Admission: RE | Admit: 2020-12-10 | Discharge: 2020-12-10 | Disposition: A | Discharge status: Home / Self Care | Payer: Medicaid Other | Source: Ambulatory Visit | Attending: Obstetrics & Gynecology | Admitting: Obstetrics & Gynecology

## 2020-12-10 ENCOUNTER — Ambulatory Visit (INDEPENDENT_AMBULATORY_CARE_PROVIDER_SITE_OTHER): Payer: Medicaid Other | Admitting: Obstetrics & Gynecology

## 2020-12-10 VITALS — BP 133/81 | HR 91 | Wt 173.6 lb

## 2020-12-10 DIAGNOSIS — Z3483 Encounter for supervision of other normal pregnancy, third trimester: Secondary | ICD-10-CM | POA: Insufficient documentation

## 2020-12-10 DIAGNOSIS — Z3A36 36 weeks gestation of pregnancy: Secondary | ICD-10-CM | POA: Diagnosis not present

## 2020-12-10 NOTE — Progress Notes (Signed)
   PRENATAL VISIT NOTE  Subjective:  Tiffany Chavez is a 30 y.o. G3P2002 at [redacted]w[redacted]d being seen today for ongoing prenatal care.  She is currently monitored for the following issues for this low-risk pregnancy and has Enterococcus UTI (urinary tract infection) in pregnancy in first and second trimesters and Encounter for supervision of normal pregnancy in third trimester on their problem list.  Patient reports no complaints.  Contractions: Not present. Vag. Bleeding: None.  Movement: Present. Denies leaking of fluid.   The following portions of the patient's history were reviewed and updated as appropriate: allergies, current medications, past family history, past medical history, past social history, past surgical history and problem list.   Objective:   Vitals:   12/10/20 0834  BP: 133/81  Pulse: 91  Weight: 173 lb 9.6 oz (78.7 kg)    Fetal Status: Fetal Heart Rate (bpm): 131 Fundal Height: 35 cm Movement: Present  Presentation: Vertex  General:  Alert, oriented and cooperative. Patient is in no acute distress.  Skin: Skin is warm and dry. No rash noted.   Cardiovascular: Normal heart rate noted  Respiratory: Normal respiratory effort, no problems with respiration noted  Abdomen: Soft, gravid, appropriate for gestational age.  Pain/Pressure: Absent     Pelvic: Cervical exam performed in the presence of a chaperone Dilation: 1 Effacement (%): Thick Station: Ballotable  Extremities: Normal range of motion.  Edema: None  Mental Status: Normal mood and affect. Normal behavior. Normal judgment and thought content.   Assessment and Plan:  Pregnancy: G3P2002 at [redacted]w[redacted]d 1. [redacted] weeks gestation of pregnancy 2. Encounter for supervision of other normal pregnancy in third trimester Pelvic cultures done today. Declined Tdap. - GC/Chlamydia probe amp ([place])not at [facility] - Strep Gp B NAA Preterm labor symptoms and general obstetric precautions including but not limited to  vaginal bleeding, contractions, leaking of fluid and fetal movement were reviewed in detail with the patient. Please refer to After Visit Summary for other counseling recommendations.   Return in about 1 week (around 12/17/2020) for OFFICE OB VISIT (MD or APP).  Future Appointments  Date Time Provider [place]  12/17/2020  8:30 AM Kacin Dancy, Sallyanne Havers, MD [facility]  12/24/2020  8:30 AM Daron Breeding, Sallyanne Havers, MD [facility]    Verita Schneiders, MD

## 2020-12-10 NOTE — Patient Instructions (Signed)
Return to office for any scheduled appointments. Call the office or go to the [facility] at [facility] [place] if:  You begin to have strong, frequent contractions  Your water breaks.  Sometimes it is a big gush of fluid, sometimes it is just a trickle that keeps getting your panties wet or running down your legs  You have vaginal bleeding.  It is normal to have a small amount of spotting if your cervix was checked.   You do not feel your baby moving like normal.  If you do not, get something to eat and drink and lay down and focus on feeling your baby move.   If your baby is still not moving like normal, you should call the office or go to MAU.  Any other obstetric concerns.   Signs and Symptoms of Labor Labor is the body's natural process of moving the baby and the placenta out of the uterus. The process of labor usually starts when the baby is full-term, between 37 and 40 weeks of pregnancy. Signs and symptoms that you are close to going into labor As your body prepares for labor and the birth of your baby, you may notice the following symptoms in the weeks and days before true labor starts:  Passing a small amount of thick, bloody mucus from your vagina. This is called normal bloody show or losing your mucus plug. This may happen more than a week before labor begins, or right before labor begins, as the opening of the cervix starts to widen (dilate). For some women, the entire mucus plug passes at once. For others, pieces of the mucus plug may gradually pass over several days.  Your baby moving (dropping) lower in your pelvis to get into position for birth (lightening). When this happens, you may feel more pressure on your bladder and pelvic bone and less pressure on your ribs. This may make it easier to breathe. It may also cause you to need to urinate more often and have problems with bowel movements.  Having "practice contractions," also called Braxton Hicks contractions  or false labor. These occur at irregular (unevenly spaced) intervals that are more than 10 minutes apart. False labor contractions are common after exercise or sexual activity. They will stop if you change position, rest, or drink fluids. These contractions are usually mild and do not get stronger over time. They may feel like: ? A backache or back pain. ? Mild cramps, similar to menstrual cramps. ? Tightening or pressure in your abdomen. Other early symptoms include:  Nausea or loss of appetite.  Diarrhea.  Having a sudden burst of energy, or feeling very tired.  Mood changes.  Having trouble sleeping.   Signs and symptoms that labor has begun Signs that you are in labor may include:  Having contractions that come at regular (evenly spaced) intervals and increase in intensity. This may feel like more intense tightening or pressure in your abdomen that moves to your back. ? Contractions may also feel like rhythmic pain in your upper thighs or back that comes and goes at regular intervals. ? For first-time mothers, this change in intensity of contractions often occurs at a more gradual pace. ? Women who have given birth before may notice a more rapid progression of contraction changes.  Feeling pressure in the vaginal area.  Your water breaking (rupture of membranes). This is when the sac of fluid that surrounds your baby breaks. Fluid leaking from your vagina may be   clear or blood-tinged. Labor usually starts within 24 hours of your water breaking, but it may take longer to begin. ? Some women may feel a sudden gush of fluid. ? Others notice that their underwear repeatedly becomes damp. Follow these instructions at home:  When labor starts, or if your water breaks, call your health care provider or nurse care line. Based on your situation, they will determine when you should go in for an exam.  During early labor, you may be able to rest and manage symptoms at home. Some strategies to  try at home include: ? Breathing and relaxation techniques. ? Taking a warm bath or shower. ? Listening to music. ? Using a heating pad on the lower back for pain. If you are directed to use heat:  Place a towel between your skin and the heat source.  Leave the heat on for 20-30 minutes.  Remove the heat if your skin turns bright red. This is especially important if you are unable to feel pain, heat, or cold. You may have a greater risk of getting burned.   Contact a health care provider if:  Your labor has started.  Your water breaks. Get help right away if:  You have painful, regular contractions that are 5 minutes apart or less.  Labor starts before you are [redacted] weeks along in your pregnancy.  You have a fever.  You have bright red blood coming from your vagina.  You do not feel your baby moving.  You have a severe headache with or without vision problems.  You have severe nausea, vomiting, or diarrhea.  You have chest pain or shortness of breath. These symptoms may represent a serious problem that is an emergency. Do not wait to see if the symptoms will go away. Get medical help right away. Call your local emergency services (911 in the [place] Do not drive yourself to the hospital. Summary  Labor is your body's natural process of moving your baby and the placenta out of your uterus.  The process of labor usually starts when your baby is full-term, between 37 and 40 weeks of pregnancy.  When labor starts, or if your water breaks, call your health care provider or nurse care line. Based on your situation, they will determine when you should go in for an exam. This information is not intended to replace advice given to you by your health care provider. Make sure you discuss any questions you have with your health care provider. Document Revised: 05/11/2020 Document Reviewed: 05/11/2020 Elsevier Patient Education  2021 Elsevier Inc.  

## 2020-12-11 LAB — GC/CHLAMYDIA PROBE AMP (~~LOC~~) NOT AT ARMC
Chlamydia: NEGATIVE
Comment: NEGATIVE
Comment: NORMAL
Neisseria Gonorrhea: NEGATIVE

## 2020-12-12 LAB — STREP GP B NAA: Strep Gp B NAA: NEGATIVE

## 2020-12-17 ENCOUNTER — Ambulatory Visit (INDEPENDENT_AMBULATORY_CARE_PROVIDER_SITE_OTHER): Payer: Medicaid Other | Admitting: Obstetrics & Gynecology

## 2020-12-17 ENCOUNTER — Encounter: Payer: Self-pay | Admitting: Obstetrics & Gynecology

## 2020-12-17 ENCOUNTER — Other Ambulatory Visit: Payer: Self-pay

## 2020-12-17 VITALS — BP 126/80 | HR 94 | Wt 173.6 lb

## 2020-12-17 DIAGNOSIS — Z3A37 37 weeks gestation of pregnancy: Secondary | ICD-10-CM

## 2020-12-17 DIAGNOSIS — Z3483 Encounter for supervision of other normal pregnancy, third trimester: Secondary | ICD-10-CM

## 2020-12-17 NOTE — Progress Notes (Signed)
   PRENATAL VISIT NOTE  Subjective:  Tiffany Chavez Tiffany Chavez is a 30 y.o. G3P2002 at [redacted]w[redacted]d being seen today for ongoing prenatal care.  She is currently monitored for the following issues for this low-risk pregnancy and has Enterococcus UTI (urinary tract infection) in pregnancy in first and second trimesters and Encounter for supervision of normal pregnancy in third trimester on their problem list.  Patient reports no complaints.  Contractions: Not present. Vag. Bleeding: None.  Movement: Present. Denies leaking of fluid.   The following portions of the patient's history were reviewed and updated as appropriate: allergies, current medications, past family history, past medical history, past social history, past surgical history and problem list.   Objective:   Vitals:   12/17/20 0905  BP: 126/80  Pulse: 94  Weight: 173 lb 9.6 oz (78.7 kg)    Fetal Status: Fetal Heart Rate (bpm): 137 Fundal Height: 36 cm Movement: Present     General:  Alert, oriented and cooperative. Patient is in no acute distress.  Skin: Skin is warm and dry. No rash noted.   Cardiovascular: Normal heart rate noted  Respiratory: Normal respiratory effort, no problems with respiration noted  Abdomen: Soft, gravid, appropriate for gestational age.  Pain/Pressure: Absent     Pelvic: Cervical exam deferred        Extremities: Normal range of motion.  Edema: None  Mental Status: Normal mood and affect. Normal behavior. Normal judgment and thought content.   Assessment and Plan:  Pregnancy: G3P2002 at [redacted]w[redacted]d 1. [redacted] weeks gestation of pregnancy 2. Encounter for supervision of other normal pregnancy in third trimester Negative pelvic cultures, patient is aware. Term labor symptoms and general obstetric precautions including but not limited to vaginal bleeding, contractions, leaking of fluid and fetal movement were reviewed in detail with the patient. Please refer to After Visit Summary for other counseling  recommendations.   Return in about 1 week (around 12/24/2020) for Virtual OB Visit with MD or APP.  Future Appointments  Date Time Provider [place]  12/24/2020  8:30 AM Zaydin Billey, Sallyanne Havers, MD [facility]    Verita Schneiders, MD

## 2020-12-17 NOTE — Patient Instructions (Signed)
Return to office for any scheduled appointments. Call the office or go to the [facility]  if:  You begin to have strong, frequent contractions  Your water breaks.  Sometimes it is a big gush of fluid, sometimes it is just a trickle that keeps getting your panties wet or running down your legs  You have vaginal bleeding.  It is normal to have a small amount of spotting if your cervix was checked.   You do not feel your baby moving like normal.  If you do not, get something to eat and drink and lay down and focus on feeling your baby move.   If your baby is still not moving like normal, you should call the office or go to MAU.  Any other obstetric concerns.   

## 2020-12-24 ENCOUNTER — Other Ambulatory Visit: Payer: Self-pay

## 2020-12-24 ENCOUNTER — Telehealth (INDEPENDENT_AMBULATORY_CARE_PROVIDER_SITE_OTHER): Payer: Medicaid Other | Admitting: Obstetrics & Gynecology

## 2020-12-24 ENCOUNTER — Encounter: Payer: Self-pay | Admitting: Obstetrics & Gynecology

## 2020-12-24 DIAGNOSIS — Z3483 Encounter for supervision of other normal pregnancy, third trimester: Secondary | ICD-10-CM

## 2020-12-24 DIAGNOSIS — Z3A38 38 weeks gestation of pregnancy: Secondary | ICD-10-CM

## 2020-12-24 NOTE — Progress Notes (Signed)
   OBSTETRICS PRENATAL VIRTUAL VISIT ENCOUNTER NOTE  Provider location: [facility] for [place] [facility]   Patient location: Home  I connected with Tiffany Chavez on 12/24/20 at  8:30 AM EDT by MyChart Video Encounter and verified that I am speaking with the correct person using two identifiers. I discussed the limitations, risks, security and privacy concerns of performing an evaluation and management service virtually and the availability of in person appointments. I also discussed with the patient that there may be a patient responsible charge related to this service. The patient expressed understanding and agreed to proceed. Subjective:  Tiffany Chavez is a 30 y.o. G3P2002 at [redacted]w[redacted]d being seen today for ongoing prenatal care.  She is currently monitored for the following issues for this low-risk pregnancy and has Enterococcus UTI (urinary tract infection) in pregnancy in first and second trimesters and Encounter for supervision of normal pregnancy in third trimester on their problem list.  Patient reports rare contractions.  Contractions: Not present. Vag. Bleeding: None.  Movement: Present. Denies any leaking of fluid.   The following portions of the patient's history were reviewed and updated as appropriate: allergies, current medications, past family history, past medical history, past social history, past surgical history and problem list.   Objective:  There were no vitals filed for this visit. (she will take later and send via MyChart)  Fetal Status:     Movement: Present     General:  Alert, oriented and cooperative. Patient is in no acute distress.  Respiratory: Normal respiratory effort, no problems with respiration noted  Mental Status: Normal mood and affect. Normal behavior. Normal judgment and thought content.  Rest of physical exam deferred due to type of encounter   Assessment and Plan:  Pregnancy: G3P2002 at [redacted]w[redacted]d 1. [redacted] weeks  gestation of pregnancy 2. Encounter for supervision of other normal pregnancy in third trimester No current issues. Will discuss plans for IOL next visit. Term labor symptoms and general obstetric precautions including but not limited to vaginal bleeding, contractions, leaking of fluid and fetal movement were reviewed in detail with the patient. I discussed the assessment and treatment plan with the patient. The patient was provided an opportunity to ask questions and all were answered. The patient agreed with the plan and demonstrated an understanding of the instructions. The patient was advised to call back or seek an in-person office evaluation/go to MAU at [facility] for any urgent or concerning symptoms. Please refer to After Visit Summary for other counseling recommendations.   I provided 6 minutes of face-to-face time during this encounter.  Return in about 1 week (around 12/31/2020).  Future Appointments  Date Time Provider [place]  12/24/2020  8:30 AM Kashonda Sarkisyan, Sallyanne Havers, MD [facility]    Verita Schneiders, MD Center for [facility], [place]

## 2020-12-24 NOTE — Patient Instructions (Signed)
Return to office for any scheduled appointments. Call the office or go to the MAU at [facility] [place] if:  You begin to have strong, frequent contractions  Your water breaks.  Sometimes it is a big gush of fluid, sometimes it is just a trickle that keeps getting your panties wet or running down your legs  You have vaginal bleeding.  It is normal to have a small amount of spotting if your cervix was checked.   You do not feel your baby moving like normal.  If you do not, get something to eat and drink and lay down and focus on feeling your baby move.   If your baby is still not moving like normal, you should call the office or go to MAU.  Any other obstetric concerns.   

## 2020-12-31 ENCOUNTER — Other Ambulatory Visit: Payer: Self-pay

## 2020-12-31 ENCOUNTER — Ambulatory Visit (INDEPENDENT_AMBULATORY_CARE_PROVIDER_SITE_OTHER): Payer: Medicaid Other | Admitting: Obstetrics and Gynecology

## 2020-12-31 VITALS — BP 121/71 | HR 93 | Wt 180.0 lb

## 2020-12-31 DIAGNOSIS — O480 Post-term pregnancy: Secondary | ICD-10-CM

## 2020-12-31 DIAGNOSIS — Z3A39 39 weeks gestation of pregnancy: Secondary | ICD-10-CM

## 2020-12-31 NOTE — Progress Notes (Signed)
    PRENATAL VISIT NOTE  Subjective:  Tiffany Chavez is a 30 y.o. G3P2002 at [redacted]w[redacted]d being seen today for ongoing prenatal care.  She is currently monitored for the following issues for this low-risk pregnancy and has Enterococcus UTI (urinary tract infection) in pregnancy in first and second trimesters and Encounter for supervision of normal pregnancy in third trimester on their problem list.  Patient reports no complaints.  Contractions: Not present. Vag. Bleeding: None.  Movement: Present. Denies leaking of fluid.   The following portions of the patient's history were reviewed and updated as appropriate: allergies, current medications, past family history, past medical history, past social history, past surgical history and problem list.   Objective:   Vitals:   12/31/20 1110  BP: 121/71  Pulse: 93  Weight: 180 lb (81.6 kg)    Fetal Status: Fetal Heart Rate (bpm): 134 Fundal Height: 39 cm Movement: Present  Presentation: Vertex  General:  Alert, oriented and cooperative. Patient is in no acute distress.  Skin: Skin is warm and dry. No rash noted.   Cardiovascular: Normal heart rate noted  Respiratory: Normal respiratory effort, no problems with respiration noted  Abdomen: Soft, gravid, appropriate for gestational age.  Pain/Pressure: Present     Pelvic: Cervical exam performed in the presence of a chaperone Dilation: Fingertip Effacement (%): Thick Station: Ballotable  Extremities: Normal range of motion.  Edema: None  Mental Status: Normal mood and affect. Normal behavior. Normal judgment and thought content.   Assessment and Plan:  Pregnancy: G3P2002 at [redacted]w[redacted]d 1. Post-term pregnancy, 40-42 weeks of gestation Set up for midnight IOL (Saturday into Sunday) - [place] MFM FETAL BPP W/NONSTRESS; Future  2. [redacted] weeks gestation of pregnancy Pt to try and leave urine for toc - Culture, OB Urine  Term labor symptoms and general obstetric precautions including but not  limited to vaginal bleeding, contractions, leaking of fluid and fetal movement were reviewed in detail with the patient. Please refer to After Visit Summary for other counseling recommendations.   Return if symptoms worsen or fail to improve.  Future Appointments  Date Time Provider [place]  01/05/2021 12:00 AM [facility] SCHED ROOM [facility] None    Aletha Halim, MD

## 2021-01-01 ENCOUNTER — Inpatient Hospital Stay (HOSPITAL_COMMUNITY)
Admission: AD | Admit: 2021-01-01 | Discharge: 2021-01-03 | DRG: 807 | Disposition: A | Discharge status: Home / Self Care | Payer: Medicaid Other | Attending: Obstetrics and Gynecology | Admitting: Obstetrics and Gynecology

## 2021-01-01 ENCOUNTER — Encounter (HOSPITAL_COMMUNITY): Payer: Self-pay | Admitting: Obstetrics and Gynecology

## 2021-01-01 ENCOUNTER — Telehealth (HOSPITAL_COMMUNITY): Payer: Self-pay | Admitting: *Deleted

## 2021-01-01 DIAGNOSIS — Z3A39 39 weeks gestation of pregnancy: Secondary | ICD-10-CM

## 2021-01-01 DIAGNOSIS — O480 Post-term pregnancy: Secondary | ICD-10-CM | POA: Diagnosis present

## 2021-01-01 DIAGNOSIS — O26893 Other specified pregnancy related conditions, third trimester: Secondary | ICD-10-CM | POA: Diagnosis present

## 2021-01-01 LAB — CBC
HCT: 35.2 % — ABNORMAL LOW (ref 36.0–46.0)
Hemoglobin: 11.3 g/dL — ABNORMAL LOW (ref 12.0–15.0)
MCH: 25.6 pg — ABNORMAL LOW (ref 26.0–34.0)
MCHC: 32.1 g/dL (ref 30.0–36.0)
MCV: 79.6 fL — ABNORMAL LOW (ref 80.0–100.0)
Platelets: 253 10*3/uL (ref 150–400)
RBC: 4.42 MIL/uL (ref 3.87–5.11)
RDW: 14.6 % (ref 11.5–15.5)
WBC: 14.7 10*3/uL — ABNORMAL HIGH (ref 4.0–10.5)
nRBC: 0 % (ref 0.0–0.2)

## 2021-01-01 LAB — TYPE AND SCREEN
ABO/RH(D): A POS
Antibody Screen: NEGATIVE

## 2021-01-01 MED ORDER — ACETAMINOPHEN 325 MG PO TABS: 650.0000 mg | ORAL_TABLET | ORAL | Status: DC | PRN

## 2021-01-01 MED ORDER — LACTATED RINGERS IV SOLN: 500.0000 mL | INTRAVENOUS | Status: DC | PRN

## 2021-01-01 MED ORDER — WITCH HAZEL-GLYCERIN EX PADS: 1.0000 "application " | MEDICATED_PAD | CUTANEOUS | Status: DC | PRN

## 2021-01-01 MED ORDER — SIMETHICONE 80 MG PO CHEW: 80.0000 mg | CHEWABLE_TABLET | ORAL | Status: DC | PRN

## 2021-01-01 MED ORDER — IBUPROFEN 600 MG PO TABS
600.0000 mg | ORAL_TABLET | Freq: Four times a day (QID) | ORAL | Status: DC
  Administered 2021-01-01 – 2021-01-03 (×7): 600 mg via ORAL
  Filled 2021-01-01 (×7): qty 1

## 2021-01-01 MED ORDER — ONDANSETRON HCL 4 MG PO TABS: 4.0000 mg | ORAL_TABLET | ORAL | Status: DC | PRN

## 2021-01-01 MED ORDER — LIDOCAINE HCL (PF) 1 % IJ SOLN: 30.0000 mL | INTRAMUSCULAR | Status: DC | PRN

## 2021-01-01 MED ORDER — OXYTOCIN BOLUS FROM INFUSION: 333.0000 mL | Freq: Once | INTRAVENOUS | Status: DC

## 2021-01-01 MED ORDER — COCONUT OIL OIL: 1.0000 "application " | TOPICAL_OIL | Status: DC | PRN

## 2021-01-01 MED ORDER — ONDANSETRON HCL 4 MG/2ML IJ SOLN: 4.0000 mg | Freq: Four times a day (QID) | INTRAMUSCULAR | Status: DC | PRN

## 2021-01-01 MED ORDER — ONDANSETRON HCL 4 MG/2ML IJ SOLN: 4.0000 mg | INTRAMUSCULAR | Status: DC | PRN

## 2021-01-01 MED ORDER — BENZOCAINE-MENTHOL 20-0.5 % EX AERO
1.0000 "application " | INHALATION_SPRAY | CUTANEOUS | Status: DC | PRN
  Administered 2021-01-01: 1 via TOPICAL
  Filled 2021-01-01: qty 56

## 2021-01-01 MED ORDER — DIBUCAINE (PERIANAL) 1 % EX OINT: 1.0000 "application " | TOPICAL_OINTMENT | CUTANEOUS | Status: DC | PRN

## 2021-01-01 MED ORDER — SOD CITRATE-CITRIC ACID 500-334 MG/5ML PO SOLN: 30.0000 mL | ORAL | Status: DC | PRN

## 2021-01-01 MED ORDER — FENTANYL CITRATE (PF) 100 MCG/2ML IJ SOLN: 50.0000 ug | INTRAMUSCULAR | Status: DC | PRN

## 2021-01-01 MED ORDER — TETANUS-DIPHTH-ACELL PERTUSSIS 5-2.5-18.5 LF-MCG/0.5 IM SUSY: 0.5000 mL | PREFILLED_SYRINGE | Freq: Once | INTRAMUSCULAR | Status: DC

## 2021-01-01 MED ORDER — PRENATAL MULTIVITAMIN CH
1.0000 | ORAL_TABLET | Freq: Every day | ORAL | Status: DC
  Administered 2021-01-02: 1 via ORAL
  Filled 2021-01-01: qty 1

## 2021-01-01 MED ORDER — SENNOSIDES-DOCUSATE SODIUM 8.6-50 MG PO TABS
2.0000 | ORAL_TABLET | Freq: Every day | ORAL | Status: DC
  Administered 2021-01-02: 2 via ORAL
  Filled 2021-01-01: qty 2

## 2021-01-01 MED ORDER — OXYTOCIN-SODIUM CHLORIDE 30-0.9 UT/500ML-% IV SOLN: 2.5000 [IU]/h | INTRAVENOUS | Status: DC

## 2021-01-01 MED ORDER — TERBUTALINE SULFATE 1 MG/ML IJ SOLN: 0.2500 mg | Freq: Once | INTRAMUSCULAR | Status: DC | PRN

## 2021-01-01 MED ORDER — ERYTHROMYCIN 5 MG/GM OP OINT
TOPICAL_OINTMENT | OPHTHALMIC | Status: AC
  Filled 2021-01-01: qty 1

## 2021-01-01 MED ORDER — OXYTOCIN 10 UNIT/ML IJ SOLN
INTRAMUSCULAR | Status: AC
  Administered 2021-01-01: 10 [IU]
  Filled 2021-01-01: qty 1

## 2021-01-01 MED ORDER — ZOLPIDEM TARTRATE 5 MG PO TABS: 5.0000 mg | ORAL_TABLET | Freq: Every evening | ORAL | Status: DC | PRN

## 2021-01-01 MED ORDER — LACTATED RINGERS IV SOLN: INTRAVENOUS | Status: DC

## 2021-01-01 MED ORDER — DIPHENHYDRAMINE HCL 25 MG PO CAPS: 25.0000 mg | ORAL_CAPSULE | Freq: Four times a day (QID) | ORAL | Status: DC | PRN

## 2021-01-01 MED ORDER — MISOPROSTOL 25 MCG QUARTER TABLET
25.0000 ug | ORAL_TABLET | ORAL | Status: DC | PRN
  Filled 2021-01-01: qty 1

## 2021-01-01 NOTE — H&P (Signed)
Tiffany Chavez is a 30 y.o. female V5I4332 with IUP at [redacted]w[redacted]d by LMP presenting for labor.  She reports positive fetal movement. She denies leakage of fluid or vaginal bleeding. Patient is in active labor with crowing upon arrival in MAU.   Prenatal History/Complications: PNC at [facility]; concern for ASD and fetal echo in Feb, 2022. She was advised to have a fetal echo after delivery.  Pregnancy complications:  - Past Medical History: Past Medical History:  Diagnosis Date  . Dermoid cyst of ovary, left 11/19/2016   Possible dermoid on the left--re-scan postpartum  . Medical history non-contributory   . Papanicolaou smear of cervix with low grade squamous intraepithelial lesion (LGSIL) 10/12/2018   Noted on Pap 05/2016    Past Surgical History: Past Surgical History:  Procedure Laterality Date  . RHINOPLASTY      Obstetrical History: OB History    Gravida  3   Para  2   Term  2   Preterm  0   AB  0   Living  2     SAB  0   IAB  0   Ectopic  0   Multiple  0   Live Births  2            Social History: Social History   Socioeconomic History  . Marital status: Married    Spouse name: Not on file  . Number of children: Not on file  . Years of education: Not on file  . Highest education level: Not on file  Occupational History  . Not on file  Tobacco Use  . Smoking status: Never Smoker  . Smokeless tobacco: Never Used  Vaping Use  . Vaping Use: Never used  Substance and Sexual Activity  . Alcohol use: No    Alcohol/week: 0.0 standard drinks  . Drug use: No  . Sexual activity: Yes    Partners: Male    Birth control/protection: None  Other Topics Concern  . Not on file  Social History Narrative  . Not on file   Social Determinants of Health   Financial Resource Strain: Not on file  Food Insecurity: Not on file  Transportation Needs: Not on file  Physical Activity: Not on file  Stress: Not on file  Social Connections: Not on  file    Family History: Family History  Problem Relation Age of Onset  . Diabetes Brother   . Diabetes Maternal Aunt     Allergies: No Known Allergies  Medications Prior to Admission  Medication Sig Dispense Refill Last Dose  . Prenat-FeFum-FePo-FA-Omega 3 (CONCEPT DHA) 53.5-38-1 MG CAPS Take 1 tablet by mouth daily. 30 capsule 8     Review of Systems   Constitutional: Negative for fever and chills Eyes: Negative for visual disturbances Respiratory: Negative for shortness of breath, dyspnea Cardiovascular: Negative for chest pain or palpitations  Gastrointestinal: Negative for vomiting, diarrhea and constipation.  POSITIVE for abdominal pain (contractions) Genitourinary: Negative for dysuria and urgency Musculoskeletal: Negative for back pain, joint pain, myalgias  Neurological: Negative for dizziness and headaches  Blood pressure 119/74, pulse 86, temperature 99 F (37.2 C), temperature source Oral, resp. rate 15, currently breastfeeding. General appearance: alert, cooperative and no distress Lungs: normal respiratory effort Heart: regular rate and rhythm Abdomen: soft, non-tender; bowel sounds normal Extremities: Homans sign is negative, no sign of DVT DTR's 2+ Presentation: cephalic Fetal monitoring  Unable to monitor as patient delivered standing in room 20 Uterine activity  contractions  Prenatal labs: ABO, Rh: A/Positive/-- (11/23 1513) Antibody: Negative (11/23 1513) Rubella: 1.94 (11/23 1513) RPR: Non Reactive (03/08 0945)  HBsAg: Negative (11/23 1513)  HIV: Non Reactive (03/08 0945)  GBS: Negative/-- (05/10 1024)  1 hr Glucola 77/140/95 Genetic screening  normal Anatomy US normal with follow-up for possible right atrial dilation; per patient fetal echo was normal. Report is in Epic   Prenatal Transfer Tool  Maternal Diabetes: No Genetic Screening: Normal Maternal Ultrasounds/Referrals: Normal Fetal Ultrasounds or other Referrals:  Fetal  echo Maternal Substance Abuse:  No Significant Maternal Medications:  None Significant Maternal Lab Results: Group B Strep negative  No results found for this or any previous visit (from the past 24 hour(s)).  Assessment: Tiffany Chavez is a 30 y.o. V6H6073 with an IUP at [redacted]w[redacted]d presenting for labor. Patient delivered infant while in MAU; see delivery note.   Plan: #Labor: expectant management #Pain:  Per request #FWB Cat 1 #ID: GBS: Neg #MOF:  Breast and bottle #MOC: Nexplanon inpatient  #Circ: NA    Mervyn Skeeters [facility] 01/01/2021, 3:24 PM

## 2021-01-01 NOTE — Discharge Summary (Addendum)
Postpartum Discharge Summary      Patient Name: Tiffany Chavez DOB: 1991-07-04 MRN: [id number]  Date of admission: 01/01/2021 Delivery date: 01/01/2021 Delivering provider: Fatima Blank A  Date of discharge: 01/03/2021 Admitting diagnosis: Active labor at term Intrauterine pregnancy: [redacted]w[redacted]d    Secondary diagnosis:  Active Problems:   Normal labor  Additional problems: None    Discharge diagnosis: Term Pregnancy Delivered                                              Post partum procedures:none Augmentation: N/A Complications: None  Hospital course: Onset of Labor With Vaginal Delivery      30y.o. yo G3P3003 at 327w5das admitted in Active Labor on 01/01/2021. Patient had an uncomplicated labor course as follows:  Membrane Rupture Time/Date: 2:57 PM ,01/01/2021   Delivery Method:Vaginal, Spontaneous  Episiotomy: None  Lacerations:  1st degree;Labial  Patient had an uncomplicated postpartum course.  She is ambulating, tolerating a regular diet, passing flatus, and urinating well. Patient is discharged home in stable condition on 01/01/21.  Newborn Data: Birth date:01/01/2021  Birth time:2:58 PM  Gender:Female  Living status:Living  Apgars:9 ,9  Weight:3400 g   Magnesium Sulfate received: No BMZ received: No Rhophylac:No MMR:No T-DaP:decined Flu: No Transfusion:No  Physical exam  Vitals:   01/01/21 1530 01/01/21 1545 01/01/21 1635 01/01/21 1800  BP: 134/76 124/87 115/90 136/77  Pulse: 89 88 89 84  Resp: _0 Temp:   98.4 F (36.9 C) 97.9 F (36.6 C)  TempSrc:   Oral Oral  SpO2:   99% 99%   General: alert, cooperative and no distress Lochia: appropriate Uterine Fundus: firm Incision: N/A DVT Evaluation: No evidence of DVT seen on physical exam. Labs: Lab Results  Component Value Date   WBC 14.7 (H) 01/01/2021   HGB 11.3 (L) 01/01/2021   HCT 35.2 (L) 01/01/2021   MCV 79.6 (L) 01/01/2021   PLT 253 01/01/2021   CMP Latest Ref  Rng & Units 06/25/2020  Glucose 65 - 99 mg/dL 111(H)  BUN 6 - 20 mg/dL 9  Creatinine 0.57 - 1.00 mg/dL 0.34(L)  Sodium 134 - 144 mmol/L 137  Potassium 3.5 - 5.2 mmol/L 3.8  Chloride 96 - 106 mmol/L 100  CO2 20 - 29 mmol/L 22  Calcium 8.7 - 10.2 mg/dL 9.7  Total Protein 6.0 - 8.5 g/dL 7.1  Total Bilirubin 0.0 - 1.2 mg/dL <0.2  Alkaline Phos 44 - 121 IU/L 57  AST 0 - 40 IU/L 15  ALT 0 - 32 IU/L 21   Edinburgh Score: Edinburgh Postnatal Depression Scale Screening Tool 04/07/2017  I have been able to laugh and see the funny side of things. 0  I have looked forward with enjoyment to things. 0  I have blamed myself unnecessarily when things went wrong. 0  I have been anxious or worried for no good reason. 0  I have felt scared or panicky for no good reason. 0  Things have been getting on top of me. 0  I have been so unhappy that I have had difficulty sleeping. 0  I have felt sad or miserable. 0  I have been so unhappy that I have been crying. 1  The thought of harming myself has occurred to me. 0  Edinburgh Postnatal Depression Scale Total 1  After visit meds:  Allergies as of 01/03/2021   No Known Allergies     Medication List    TAKE these medications   Concept DHA 53.5-38-1 MG Caps Take 1 tablet by mouth daily.   ibuprofen 600 MG tablet Commonly known as: ADVIL Take 1 tablet (600 mg total) by mouth every 6 (six) hours.        Discharge home in stable condition Infant Feeding: Bottle and Breast Infant Disposition:home with mother Discharge instruction: per After Visit Summary and Postpartum booklet. Activity: Advance as tolerated. Pelvic rest for 6 weeks.  Diet: routine diet Future Appointments: Future Appointments  Date Time Provider [place]  01/03/2021 10:15 AM [facility] None   Follow up Visit:  Message sent to [place] on 01/01/21:  Please schedule this patient for a In person postpartum visit in 4 weeks with the following provider: Any  provider. Additional Postpartum F/U:n/a  Low risk pregnancy complicated by: n/a Delivery mode:  Vaginal, Spontaneous  Anticipated Birth Control:  Nexplanon   01/03/2021 Truett Mainland, DO

## 2021-01-01 NOTE — Telephone Encounter (Signed)
Preadmission screen  

## 2021-01-01 NOTE — MAU Note (Signed)
Call received from registration desk [phone]- pt wanting to push, baby coming.  wc brought to rm 120Leftwich-Kirby and Kooistra CNMs present for delivery.  1457 pt stood up, srom clear fluid, fetal head noted. 1458 SVD by Leftwich-Kirby CNM

## 2021-01-02 LAB — RPR: RPR Ser Ql: NONREACTIVE

## 2021-01-02 NOTE — Progress Notes (Signed)
POSTPARTUM PROGRESS NOTE  Subjective: Tiffany Chavez is a 30 y.o. B3X8329 s/p SVD at [redacted]w[redacted]d.  She reports she doing well. No acute events overnight. She denies any problems with ambulating, voiding or po intake. Denies nausea or vomiting. She has passed flatus. Pain is well controlled.  Lochia is mild.  Objective: Blood pressure 123/76, pulse 91, temperature 98.1 F (36.7 C), temperature source Oral, resp. rate 18, SpO2 99 %, unknown if currently breastfeeding.  Physical Exam:  General: alert, cooperative and no distress Chest: no respiratory distress Abdomen: soft, non-tender  Uterine Fundus: firm and at level of umbilicus Extremities: No calf swelling or tenderness  no edema  Recent Labs    01/01/21 1548  HGB 11.3*  HCT 35.2*    Assessment/Plan: Tiffany Chavez is a 30 y.o. V9T6606 s/p SVD at [redacted]w[redacted]d. PPD#1.  Routine Postpartum Care: Doing well, pain well-controlled.  -- Continue routine care, lactation support  -- Contraception: desires nexplanon placement as outpatient -- Feeding: breast     Dispo: Plan for discharge PPD#2.  Janet Berlin, MD OB Fellow, Faculty Practice 01/02/2021 6:54 AM

## 2021-01-03 ENCOUNTER — Other Ambulatory Visit (HOSPITAL_COMMUNITY): Payer: Medicaid Other | Attending: Family Medicine

## 2021-01-03 MED ORDER — IBUPROFEN 600 MG PO TABS: 600.0000 mg | ORAL_TABLET | Freq: Four times a day (QID) | ORAL | 0 refills | Status: AC

## 2021-01-03 NOTE — Discharge Instructions (Signed)

## 2021-01-05 ENCOUNTER — Inpatient Hospital Stay (HOSPITAL_COMMUNITY): Payer: Medicaid Other

## 2021-01-05 ENCOUNTER — Inpatient Hospital Stay (HOSPITAL_COMMUNITY)
Admission: AD | Admit: 2021-01-05 | Payer: Medicaid Other | Source: Home / Self Care | Admitting: Obstetrics and Gynecology

## 2021-02-06 ENCOUNTER — Encounter: Payer: Self-pay | Admitting: Obstetrics & Gynecology

## 2021-02-06 ENCOUNTER — Other Ambulatory Visit: Payer: Self-pay

## 2021-02-06 ENCOUNTER — Ambulatory Visit (INDEPENDENT_AMBULATORY_CARE_PROVIDER_SITE_OTHER): Payer: Medicaid Other | Admitting: Obstetrics & Gynecology

## 2021-02-06 DIAGNOSIS — Z3202 Encounter for pregnancy test, result negative: Secondary | ICD-10-CM | POA: Diagnosis not present

## 2021-02-06 DIAGNOSIS — Z30017 Encounter for initial prescription of implantable subdermal contraceptive: Secondary | ICD-10-CM | POA: Diagnosis not present

## 2021-02-06 LAB — POCT URINALYSIS DIPSTICK
Bilirubin, UA: NEGATIVE
Blood, UA: NEGATIVE
Glucose, UA: NEGATIVE
Ketones, UA: NEGATIVE
Leukocytes, UA: NEGATIVE
Nitrite, UA: NEGATIVE
Protein, UA: NEGATIVE
Spec Grav, UA: 1.015 (ref 1.010–1.025)
Urobilinogen, UA: NEGATIVE E.U./dL — AB
pH, UA: 7 (ref 5.0–8.0)

## 2021-02-06 LAB — POCT URINE PREGNANCY: Preg Test, Ur: NEGATIVE

## 2021-02-06 MED ORDER — ETONOGESTREL 68 MG ~~LOC~~ IMPL
68.0000 mg | DRUG_IMPLANT | Freq: Once | SUBCUTANEOUS | Status: AC
  Administered 2021-02-06: 68 mg via SUBCUTANEOUS

## 2021-02-06 NOTE — Patient Instructions (Signed)
Nexplanon Instructions After Insertion  Keep bandage clean and dry for 24 hours  May use ice/Tylenol/Ibuprofen for soreness or pain  If you develop fever, drainage or increased warmth from incision site-contact office immediately   

## 2021-02-06 NOTE — Addendum Note (Signed)
Addended by: Penny Pia on: 02/06/2021 03:30 PM   Modules accepted: Orders

## 2021-02-06 NOTE — Progress Notes (Signed)
Reports urinary burning last week. Resolved but would like a UA.

## 2021-02-06 NOTE — Progress Notes (Signed)
Post Partum Visit Note  Tiffany Chavez Tiffany Chavez is a 30 y.o. G4P3003 female who presents for a postpartum visit. She is 5 weeks postpartum following a normal spontaneous vaginal delivery.  I have fully reviewed the prenatal and intrapartum course. The delivery was at 39.5 gestational weeks.  Anesthesia: none. Postpartum course has been uncomplicated. Baby is doing well. Baby is feeding by breast. Bleeding no bleeding. Bowel function is normal. Bladder function is normal. Patient is not sexually active. Contraception method is none. Postpartum depression screening: negative.   The pregnancy intention screening data noted above was reviewed. Potential methods of contraception were discussed. The patient elected to proceed with Hormonal Implant.    Edinburgh Postnatal Depression Scale - 02/06/21 1450       Edinburgh Postnatal Depression Scale:  In the Past 7 Days   I have been able to laugh and see the funny side of things. 0    I have looked forward with enjoyment to things. 0    I have blamed myself unnecessarily when things went wrong. 0    I have been anxious or worried for no good reason. 0    I have felt scared or panicky for no good reason. 0    Things have been getting on top of me. 0    I have been so unhappy that I have had difficulty sleeping. 0    I have felt sad or miserable. 0    I have been so unhappy that I have been crying. 0    The thought of harming myself has occurred to me. 0    Edinburgh Postnatal Depression Scale Total 0             Health Maintenance Due  Topic Date Due   COVID-19 Vaccine (1) Never done    The following portions of the patient's history were reviewed and updated as appropriate: allergies, current medications, past family history, past medical history, past social history, past surgical history, and problem list.  Review of Systems Pertinent items noted in HPI and remainder of comprehensive ROS otherwise negative.  Objective:   BP 113/73   Pulse 80   Ht 5\' 1"  (1.549 m)   Wt 158 lb 6.4 oz (71.8 kg)   Breastfeeding Yes   BMI 29.93 kg/m    General:  alert and no distress   Breasts:  normal  Lungs: clear to auscultation bilaterally  Heart:  regular rate and rhythm  Abdomen: soft, non-tender; bowel sounds normal; no masses,  no organomegaly     Nexplanon Insertion Procedure Patient identified, informed consent performed, consent signed.   Patient does understand that irregular bleeding is a very common side effect of this medication. She was advised to have backup contraception for one week after placement. Pregnancy test in clinic today was negative.  Appropriate time out taken.  Patient's left arm was prepped and draped in the usual sterile fashion. The ruler used to measure and mark insertion area.  Patient was prepped with alcohol swab and then injected with 3 ml of 1% lidocaine.  She was prepped with betadine, Nexplanon removed from packaging,  Device confirmed in needle, then inserted full length of needle and withdrawn per handbook instructions. Nexplanon was able to palpated in the patient's arm; patient palpated the insert herself. There was minimal blood loss.  Patient insertion site covered with guaze and a pressure bandage to reduce any bruising.  The patient tolerated the procedure well and was given post procedure  instructions.        Assessment:  1. Insertion of Nexplanon 2. Postpartum care following vaginal delivery  Plan:   Essential components of care per ACOG recommendations:  1.  Mood and well being: Patient with negative depression screening today. Reviewed local resources for support.  - Patient tobacco use? No.   - hx of drug use? No.    2. Infant care and feeding:  -Patient currently breastmilk feeding? Yes. Discussed returning to work and pumping. Reviewed importance of draining breast regularly to support lactation.  -Social determinants of health (SDOH) reviewed in EPIC. No  concerns.  3. Sexuality, contraception and birth spacing - Patient does not want a pregnancy in the next year.  Desired family size is 3-4 children.  - Reviewed forms of contraception in tiered fashion. Patient desired  Nexplanon , this was placed today.   - Discussed birth spacing of 18 months  4. Sleep and fatigue -Encouraged family/partner/community support of 4 hrs of uninterrupted sleep to help with mood and fatigue  5. Physical Recovery  - Discussed patients delivery and complications. She describes her labor as fast and good. - Patient had a Vaginal, no problems at delivery. Patient had a 1st degree laceration. Perineal healing reviewed. Patient expressed understanding - Patient has urinary incontinence? No. - Patient is safe to resume physical and sexual activity  6.  Health Maintenance - HM due items addressed Yes - Last pap smear  Diagnosis  Date Value Ref Range Status  10/12/2018   Final   NEGATIVE FOR INTRAEPITHELIAL LESIONS OR MALIGNANCY.   Pap smear not done at today's visit, due 10/2021     Verita Schneiders, MD [facility] [place]

## 2021-09-06 ENCOUNTER — Encounter: Payer: Self-pay | Admitting: Radiology

## 2021-11-27 ENCOUNTER — Ambulatory Visit (INDEPENDENT_AMBULATORY_CARE_PROVIDER_SITE_OTHER): Payer: Medicaid Other | Admitting: Obstetrics & Gynecology

## 2021-11-27 ENCOUNTER — Encounter: Payer: Self-pay | Admitting: Obstetrics & Gynecology

## 2021-11-27 ENCOUNTER — Other Ambulatory Visit (HOSPITAL_COMMUNITY)
Admission: RE | Admit: 2021-11-27 | Discharge: 2021-11-27 | Disposition: A | Discharge status: Home / Self Care | Payer: Medicaid Other | Source: Ambulatory Visit | Attending: Obstetrics & Gynecology | Admitting: Obstetrics & Gynecology

## 2021-11-27 VITALS — BP 119/74 | HR 73 | Ht 61.0 in | Wt 146.0 lb

## 2021-11-27 DIAGNOSIS — B379 Candidiasis, unspecified: Secondary | ICD-10-CM | POA: Diagnosis not present

## 2021-11-27 DIAGNOSIS — Z975 Presence of (intrauterine) contraceptive device: Secondary | ICD-10-CM

## 2021-11-27 DIAGNOSIS — R399 Unspecified symptoms and signs involving the genitourinary system: Secondary | ICD-10-CM | POA: Diagnosis not present

## 2021-11-27 DIAGNOSIS — Z01419 Encounter for gynecological examination (general) (routine) without abnormal findings: Secondary | ICD-10-CM | POA: Insufficient documentation

## 2021-11-27 DIAGNOSIS — B3731 Acute candidiasis of vulva and vagina: Secondary | ICD-10-CM

## 2021-11-27 LAB — POCT URINALYSIS DIPSTICK
Bilirubin, UA: NEGATIVE
Glucose, UA: NEGATIVE
Ketones, UA: NEGATIVE
Leukocytes, UA: NEGATIVE
Nitrite, UA: NEGATIVE
Protein, UA: NEGATIVE
Spec Grav, UA: >=1.03 — AB (ref 1.010–1.025)
Urobilinogen, UA: 0.2 E.U./dL
pH, UA: 6 (ref 5.0–8.0)

## 2021-11-27 NOTE — Progress Notes (Signed)
? ? ?GYNECOLOGY ANNUAL PREVENTATIVE CARE ENCOUNTER NOTE ? ?History:    ? Tiffany Chavez Tiffany Chavez is a 31 y.o. G41P3003 female here for a routine annual gynecologic exam.  Current complaints: some burning with urination, but self-treated with some antibiotics while out of town.  Wants to be evaluated for UTI, mild dysuria is still present.   Denies abnormal vaginal bleeding, discharge, pelvic pain, problems with intercourse or other gynecologic concerns.  ?  ?Gynecologic History ?No LMP recorded. ?Contraception: Nexplanon since 02/06/2021 ?Last Pap: 10/12/2018. Result was normal with negative HPV ? ?Obstetric History ?OB History  ?Gravida Para Term Preterm AB Living  ?'3 3 3 '$ 0 0 3  ?SAB IAB Ectopic Multiple Live Births  ?0 0 0 0 3  ?  ?# Outcome Date GA Lbr Len/2nd Weight Sex Delivery Anes PTL Lv  ?3 Term 01/01/21 108w5d03:26 / 00:02 7 lb 7.9 oz (3.4 kg) F Vag-Spont None  LIV  ?2 Term 03/06/17 412w4d2:08 7 lb 0.2 oz (3.181 kg) F Vag-Spont EPI  LIV  ?1 Term 12/20/14 3954w6d:15 / 00:34 7 lb 3 oz (3.26 kg) M Vag-Spont EPI  LIV  ? ? ?Past Medical History:  ?Diagnosis Date  ? Dermoid cyst of ovary, left 11/19/2016  ? Possible dermoid on the left--re-scan postpartum  ? Medical history non-contributory   ? Papanicolaou smear of cervix with low grade squamous intraepithelial lesion (LGSIL) 10/12/2018  ? Noted on Pap 05/2016  ? ? ?Past Surgical History:  ?Procedure Laterality Date  ? RHINOPLASTY    ? ? ?Current Outpatient Medications on File Prior to Visit  ?Medication Sig Dispense Refill  ? ibuprofen (ADVIL) 600 MG tablet Take 1 tablet (600 mg total) by mouth every 6 (six) hours. (Patient not taking: Reported on 02/06/2021) 30 tablet 0  ? Prenat-FeFum-FePo-FA-Omega 3 (CONCEPT DHA) 53.5-38-1 MG CAPS Take 1 tablet by mouth daily. (Patient not taking: Reported on 11/27/2021) 30 capsule 8  ? ?No current facility-administered medications on file prior to visit.  ? ? ?No Known Allergies ? ?Social History:  reports that she has  never smoked. She has never used smokeless tobacco. She reports that she does not drink alcohol and does not use drugs. ? ?Family History  ?Problem Relation Age of Onset  ? Diabetes Brother   ? Diabetes Maternal Aunt   ? ? ?The following portions of the patient's history were reviewed and updated as appropriate: allergies, current medications, past family history, past medical history, past social history, past surgical history and problem list. ? ?Review of Systems ?Pertinent items noted in HPI and remainder of comprehensive ROS otherwise negative. ? ?Physical Exam:  ?BP 119/74   Pulse 73   Ht '5\' 1"'$  (1.549 m)   Wt 146 lb (66.2 kg)   BMI 27.59 kg/m?  ?CONSTITUTIONAL: Well-developed, well-nourished female in no acute distress.  ?HENT:  Normocephalic, atraumatic, External right and left ear normal.  ?EYES: Conjunctivae and EOM are normal. Pupils are equal, round, and reactive to light. No scleral icterus.  ?NECK: Normal range of motion, supple, no masses.  Normal thyroid.  ?SKIN: Skin is warm and dry. No rash noted. Not diaphoretic. No erythema. No pallor. ?MUSCULOSKELETAL: Normal range of motion. No tenderness.  No cyanosis, clubbing, or edema. ?NEUROLOGIC: Alert and oriented to person, place, and time. Normal reflexes, muscle tone coordination.  ?PSYCHIATRIC: Normal mood and affect. Normal behavior. Normal judgment and thought content. ?CARDIOVASCULAR: Normal heart rate noted, regular rhythm ?RESPIRATORY: Clear to auscultation bilaterally. Effort and breath sounds  normal, no problems with respiration noted. ?BREASTS: Symmetric in size. No masses, tenderness, skin changes, nipple drainage, or lymphadenopathy bilaterally. Performed in the presence of a chaperone. ?ABDOMEN: Soft, no distention noted.  No tenderness, rebound or guarding.  ?PELVIC: Normal appearing external genitalia and urethral meatus; normal appearing vaginal mucosa and multiparous cervix.  No abnormal vaginal discharge noted.  Pap smear obtained.   Normal uterine size, no other palpable masses, no uterine or adnexal tenderness.  Performed in the presence of a chaperone. ? ?Results for orders placed or performed in visit on 11/27/21 (from the past 24 hour(s))  ?POCT Urinalysis Dipstick     Status: Abnormal  ? Collection Time: 11/27/21  8:34 AM  ?Result Value Ref Range  ? Color, UA yellow   ? Clarity, UA clear   ? Glucose, UA Negative Negative  ? Bilirubin, UA Neg   ? Ketones, UA Neg   ? Spec Grav, UA >=1.030 (A) 1.010 - 1.025  ? Blood, UA Small   ? pH, UA 6.0 5.0 - 8.0  ? Protein, UA Negative Negative  ? Urobilinogen, UA 0.2 0.2 or 1.0 E.U./dL  ? Nitrite, UA Neg   ? Leukocytes, UA Negative Negative  ? Appearance    ? Odor    ? ? ?  ?Assessment and Plan:  ?   ?1. Nexplanon in place since 02/06/2021 ?No concerns, can be in place for 4 years. ? ?2. UTI symptoms ?Already treated with antibiotics as per patient ?- POCT Urinalysis Dipstick with small blood, nothing else ?- Urine Culture sent, will follow up results and manage accordingly. ?- Cervicovaginal ancillary only done to rule out vulvovaginitis as cause of burning sensation. ? ?3. Well woman exam with routine gynecological exam ?- Cytology - PAP( [place]) ?Will follow up results of pap smear and manage accordingly. ?Routine preventative health maintenance measures emphasized. ?Please refer to After Visit Summary for other counseling recommendations.  ?   ? ?Verita Schneiders, MD, FACOG ?Obstetrician Social research officer, government, Faculty Practice ?[facility] ? ?

## 2021-11-28 LAB — CERVICOVAGINAL ANCILLARY ONLY
Bacterial Vaginitis (gardnerella): NEGATIVE
Candida Glabrata: POSITIVE — AB
Candida Vaginitis: POSITIVE — AB
Comment: NEGATIVE
Comment: NEGATIVE
Comment: NEGATIVE

## 2021-11-29 LAB — URINE CULTURE

## 2021-12-01 ENCOUNTER — Telehealth: Payer: Self-pay

## 2021-12-01 MED ORDER — BORIC ACID CRYS: 600.0000 mg | CRYSTALS | Freq: Every day | 2 refills | Status: AC

## 2021-12-01 MED ORDER — FLUCONAZOLE 150 MG PO TABS: 150.0000 mg | ORAL_TABLET | Freq: Once | ORAL | 3 refills | Status: AC

## 2021-12-01 NOTE — Addendum Note (Signed)
Addended by: Verita Schneiders A on: 12/01/2021 11:09 AM ? ? Modules accepted: Orders ? ?

## 2021-12-01 NOTE — Telephone Encounter (Signed)
Return call to pt regarding vm  ?Pt had states she did not receive boric Acid Rx only diflucan  ?I called the pharmacy they stated Rx was received and pt picked up rx just now. ?Pt may have tried to get Rx too early ?I wanted to call and be sure ?Pt not ava LVM for pt to return call if anything was needed from our office.  ?  ? ?

## 2021-12-02 LAB — CYTOLOGY - PAP: Diagnosis: NEGATIVE

## 2022-02-12 ENCOUNTER — Other Ambulatory Visit: Payer: Self-pay

## 2022-02-12 DIAGNOSIS — B3731 Acute candidiasis of vulva and vagina: Secondary | ICD-10-CM

## 2022-02-12 MED ORDER — FLUCONAZOLE 150 MG PO TABS: 150.0000 mg | ORAL_TABLET | Freq: Once | ORAL | 1 refills | Status: AC

## 2022-02-12 NOTE — Progress Notes (Signed)
Pt called requesting Rx for yeast. Rx sent per protocol.

## 2022-03-30 IMAGING — US US MFM OB DETAIL+14 WK
1 series · 13 of 28 positions shown · non-contrast
Comparison: none

[Series 1: us mfm ob detail+14 wk · 13 of 169 slices shown]
[im 7/169]
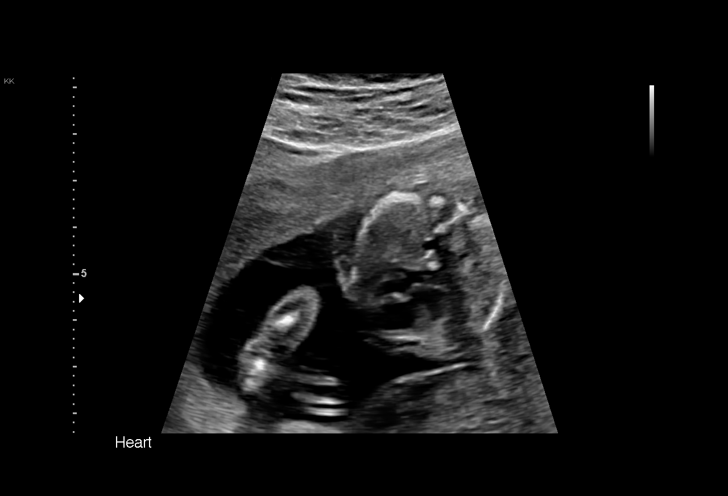
[im 19/169]
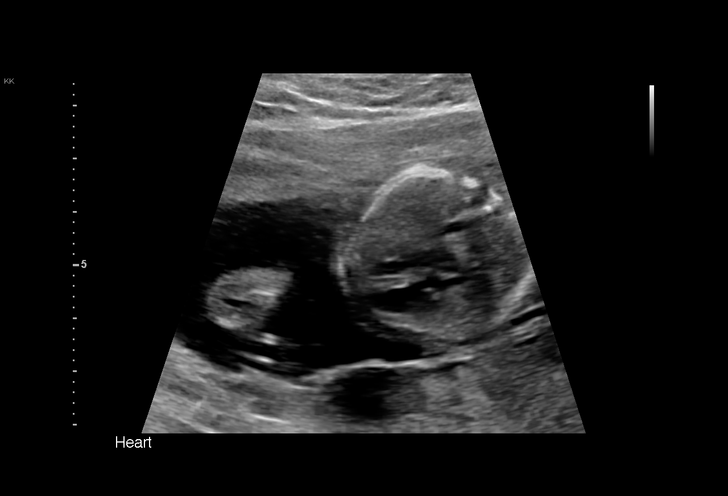
[im 32/169]
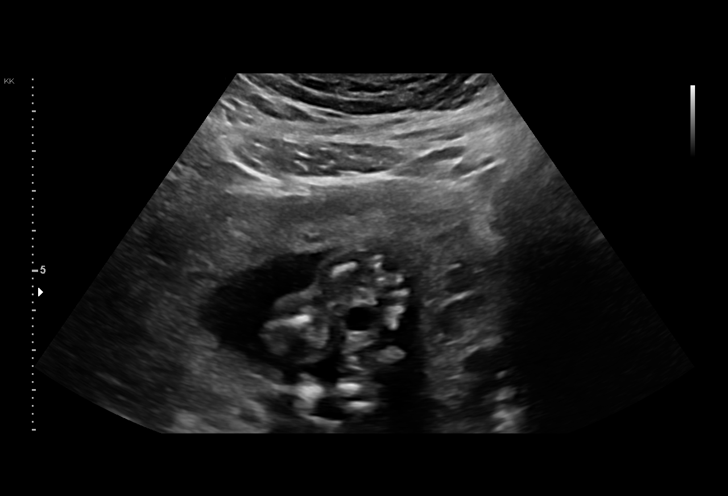
[im 44/169]
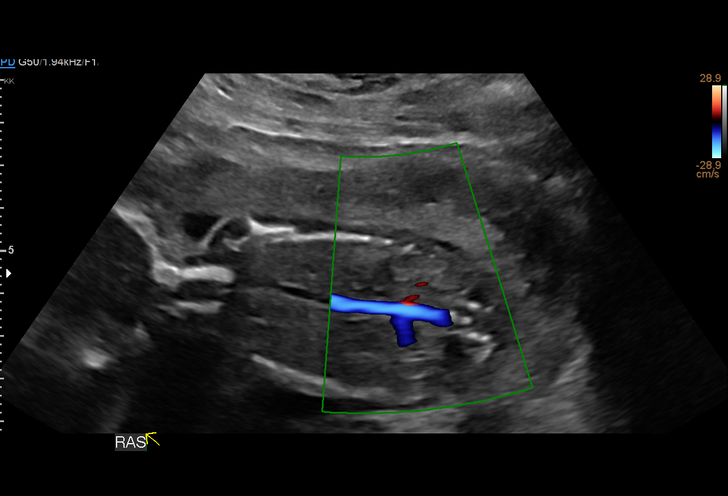
[im 57/169]
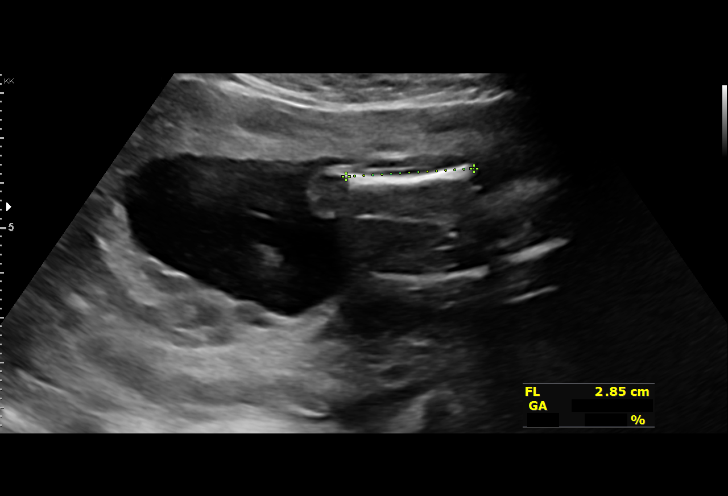
[im 69/169]
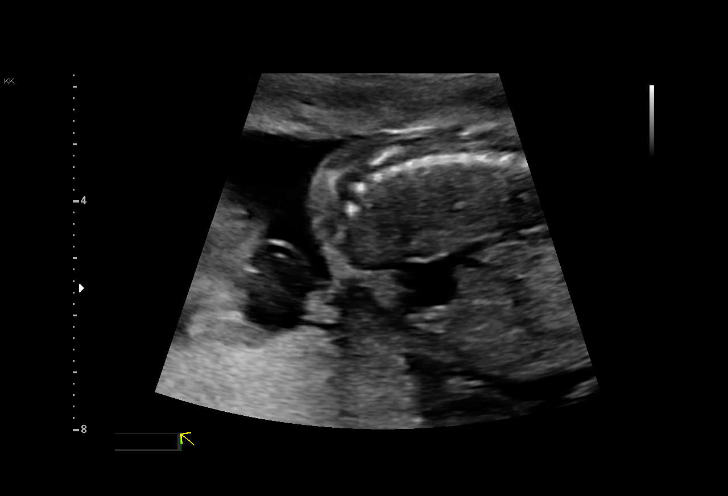
[im 88/169]
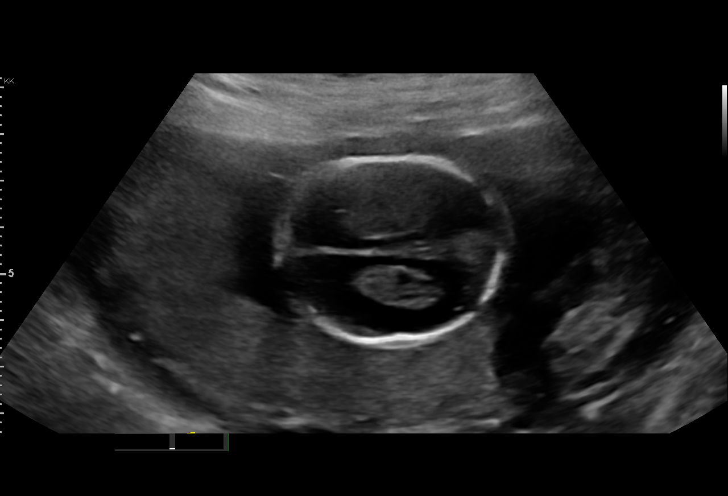
[im 100/169]
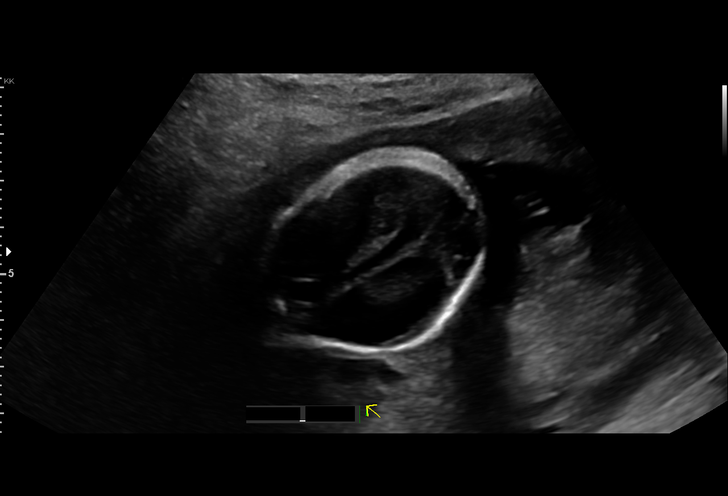
[im 113/169]
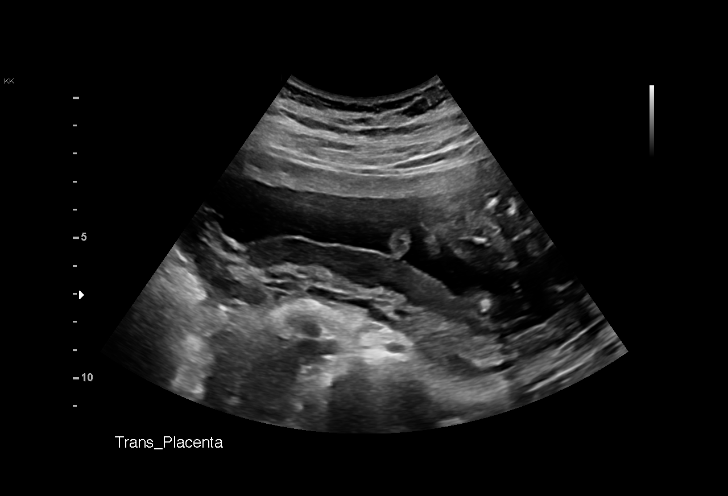
[im 125/169]
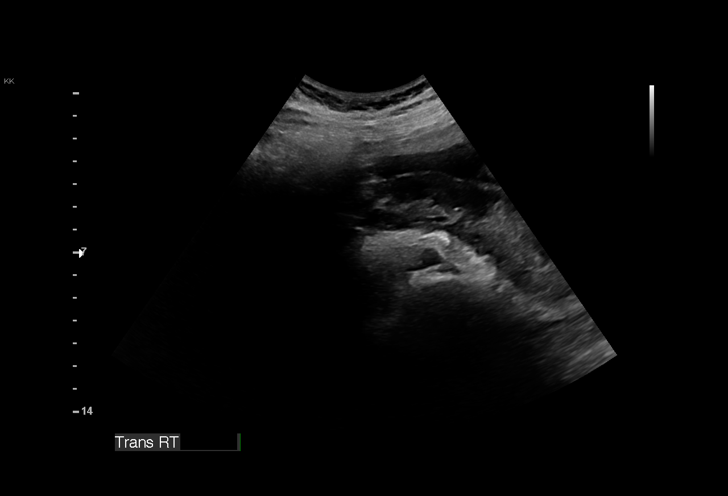
[im 137/169]
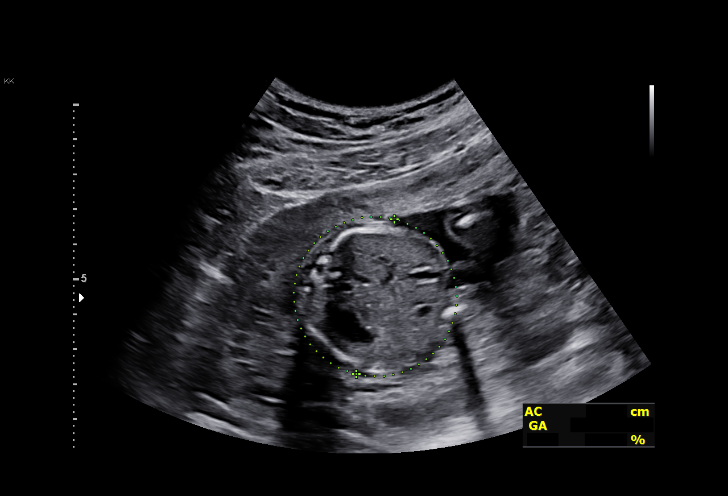
[im 150/169]
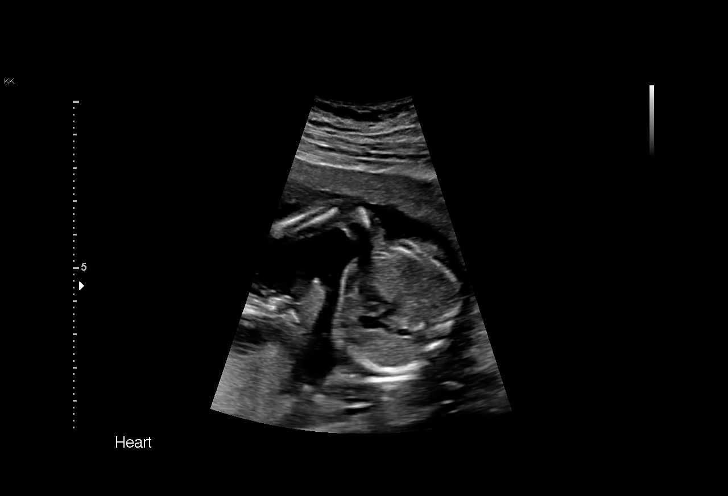
[im 162/169]
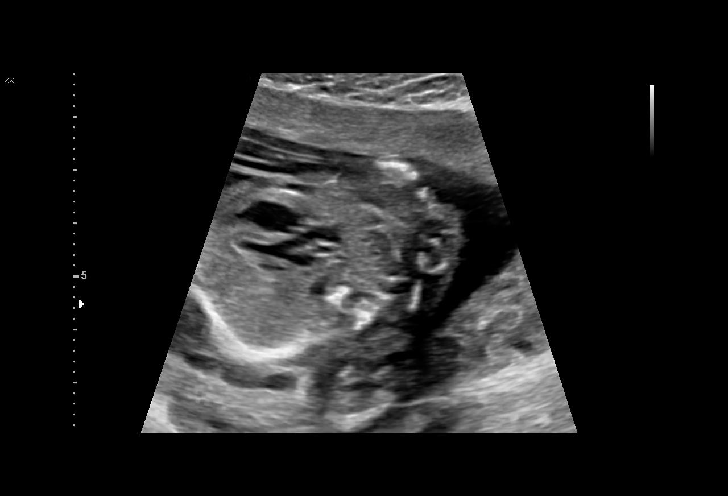

[13 of 28 positions shown; findings below may reference images not displayed]

[REDACTED]

Indications

 Encounter for antenatal screening for
 malformations
 Fetal choroid plexus cyst
 Low Risk NIPS,Negative AFP
 Encounter for uncertain dates
 18 weeks gestation of pregnancy
Fetal Evaluation

 Num Of Fetuses:         1
 Fetal Heart Rate(bpm):  147
 Cardiac Activity:       Observed
 Presentation:           Breech
 Placenta:               Posterior
 P. Cord Insertion:      Marginal insertion

 Amniotic Fluid
 AFI FV:      Within normal limits

                             Largest Pocket(cm)

Biometry

 BPD:        39  mm     G. Age:  17w 6d         21  %    CI:        66.36   %    70 - 86
                                                         FL/HC:      17.9   %    16.1 -
 HC:      153.5  mm     G. Age:  18w 2d         31  %    HC/AC:      1.08        1.09 -
 AC:      142.2  mm     G. Age:  19w 4d         79  %    FL/BPD:     70.5   %
 FL:       27.5  mm     G. Age:  18w 3d         37  %    FL/AC:      19.3   %    20 - 24
 HUM:      27.6  mm     G. Age:  18w 6d         60  %

 Est. FW:     264  gm      0 lb 9 oz     67  %
OB History

 Gravidity:    3         Term:   2        Prem:   0        SAB:   0
 TOP:          0       Ectopic:  0        Living: 2
Gestational Age

 U/S Today:     18w 4d                                        EDD:   01/03/21
 Best:          18w 4d     Det. By:  U/S (08/06/20)           EDD:   01/03/21
Anatomy

 Cranium:               Appears normal         Aortic Arch:            Appears normal
 Cavum:                 Appears normal         Ductal Arch:            Appears normal
 Ventricles:            Appears normal         Diaphragm:              Not well visualized
 Choroid Plexus:        Appears normal         Stomach:                Appears normal, left
                                                                       sided
 Cerebellum:            Appears normal         Abdomen:                Appears normal
 Posterior Fossa:       Appears normal         Abdominal Wall:         Appears nml (cord
                                                                       insert, abd wall)
 Nuchal Fold:           Appears normal         Cord Vessels:           Appears normal (3
                                                                       vessel cord)
 Face:                  Orbits appear          Kidneys:                Appear normal
                        normal
 Lips:                  Appears normal         Bladder:                Appears normal
 Thoracic:              Appears normal         Spine:                  Appears normal
 Heart:                 Abnormal, see          Upper Extremities:      Appears normal
                        comments
 RVOT:                  Appears normal         Lower Extremities:      Appears normal
 LVOT:                  Appears normal

 Other:  Heels and 5th digit visualized. Technically difficult due to maternal
         habitus and fetal position.
Cervix Uterus Adnexa

 Cervix
 Length:           3.85  cm.
 Normal appearance by transabdominal scan.

 Adnexa
 No abnormality visualized.
Impression

 Single intrauterine pregnancy here for a detailed anatomy for
 uncertain dates and suspected choroid plexus cyst.
 Normal anatomy with measurements consistent with dates
 There is good fetal movement and amniotic fluid volume
 Suboptimal views of the fetal anatomy were obtained
 secondary to fetal position.

 Ms. KETURAH PRINS has a low risk NIPS, and neg AFP
 Today we observed an enlarged right atrium. We could not
 visualize the four chamber heart. We observed normal
 outflow tracts, color doppler was not investigated. The right
 ventricle appeared normal, the overall heart was not enlarged
 and the pulmonary was well visualized. I explained that this
 may be a normal variant vs possible Jimi anomaly.
 Therefore, I have placed a request for a fetal echocardiogram.
Recommendations

 Follow up growth in 4 weeks.
 Fetal echocardiogram.

## 2022-05-21 ENCOUNTER — Telehealth: Payer: Self-pay

## 2022-05-21 NOTE — Telephone Encounter (Signed)
Left message for pt to call offcie back regarding needs left w/ answering service.

## 2022-06-11 ENCOUNTER — Other Ambulatory Visit: Payer: Self-pay | Admitting: *Deleted

## 2022-06-11 DIAGNOSIS — R0789 Other chest pain: Secondary | ICD-10-CM

## 2022-06-11 DIAGNOSIS — I493 Ventricular premature depolarization: Secondary | ICD-10-CM

## 2022-06-18 ENCOUNTER — Ambulatory Visit: Payer: MEDICAID | Attending: Internal Medicine

## 2022-06-18 DIAGNOSIS — I493 Ventricular premature depolarization: Secondary | ICD-10-CM | POA: Diagnosis not present

## 2022-06-18 DIAGNOSIS — R0789 Other chest pain: Secondary | ICD-10-CM | POA: Diagnosis not present

## 2022-11-03 ENCOUNTER — Other Ambulatory Visit: Payer: Self-pay | Admitting: Family Medicine

## 2022-11-03 DIAGNOSIS — R1011 Right upper quadrant pain: Secondary | ICD-10-CM

## 2022-11-24 ENCOUNTER — Other Ambulatory Visit (HOSPITAL_COMMUNITY)
Admission: RE | Admit: 2022-11-24 | Discharge: 2022-11-24 | Disposition: A | Discharge status: Home / Self Care | Payer: Medicaid Other | Source: Ambulatory Visit | Attending: Obstetrics and Gynecology | Admitting: Obstetrics and Gynecology

## 2022-11-24 ENCOUNTER — Ambulatory Visit
Admission: RE | Admit: 2022-11-24 | Discharge: 2022-11-24 | Disposition: A | Discharge status: Home / Self Care | Payer: Medicaid Other | Source: Ambulatory Visit | Attending: Family Medicine | Admitting: Family Medicine

## 2022-11-24 ENCOUNTER — Ambulatory Visit: Payer: Medicaid Other

## 2022-11-24 VITALS — BP 122/81 | HR 81

## 2022-11-24 DIAGNOSIS — N898 Other specified noninflammatory disorders of vagina: Secondary | ICD-10-CM | POA: Diagnosis present

## 2022-11-24 DIAGNOSIS — R399 Unspecified symptoms and signs involving the genitourinary system: Secondary | ICD-10-CM | POA: Diagnosis not present

## 2022-11-24 DIAGNOSIS — R1011 Right upper quadrant pain: Secondary | ICD-10-CM

## 2022-11-24 LAB — POCT URINALYSIS DIPSTICK
Bilirubin, UA: NEGATIVE
Blood, UA: NEGATIVE
Glucose, UA: NEGATIVE
Ketones, UA: NEGATIVE
Nitrite, UA: NEGATIVE
Protein, UA: NEGATIVE
Spec Grav, UA: 1.01 (ref 1.010–1.025)
Urobilinogen, UA: 0.2 E.U./dL
pH, UA: 6 (ref 5.0–8.0)

## 2022-11-24 NOTE — Progress Notes (Signed)
SUBJECTIVE:  32 y.o. female complains of clear vaginal discharge and dysuria  Denies abnormal vaginal bleeding or significant pelvic pain or fever.Denies history of known exposure to STD.  No LMP recorded.  OBJECTIVE:  She appears alert, well appearing, in no apparent distress Urine dipstick: positive for leukocytes.  ASSESSMENT:  Vaginal Discharge  Dysuria    PLAN:  GC, chlamydia, trichomonas, BVAG, CVAG probe and urine culture sent to lab. Treatment: To be determined once lab results are received ROV prn if symptoms persist or worsen.

## 2022-11-25 LAB — CERVICOVAGINAL ANCILLARY ONLY
Bacterial Vaginitis (gardnerella): NEGATIVE
Candida Glabrata: NEGATIVE
Candida Vaginitis: NEGATIVE
Chlamydia: NEGATIVE
Comment: NEGATIVE
Comment: NEGATIVE
Comment: NEGATIVE
Comment: NEGATIVE
Comment: NEGATIVE
Comment: NORMAL
Neisseria Gonorrhea: NEGATIVE
Trichomonas: NEGATIVE

## 2022-11-27 LAB — URINE CULTURE

## 2022-12-02 NOTE — Telephone Encounter (Signed)
Return call to pt regarding Urine Cx results   No answer LVM.

## 2022-12-10 ENCOUNTER — Encounter: Payer: Self-pay | Admitting: Advanced Practice Midwife

## 2022-12-10 ENCOUNTER — Ambulatory Visit (INDEPENDENT_AMBULATORY_CARE_PROVIDER_SITE_OTHER): Payer: Medicaid Other | Admitting: Advanced Practice Midwife

## 2022-12-10 ENCOUNTER — Other Ambulatory Visit (HOSPITAL_COMMUNITY)
Admission: RE | Admit: 2022-12-10 | Discharge: 2022-12-10 | Disposition: A | Discharge status: Home / Self Care | Payer: Medicaid Other | Source: Ambulatory Visit | Attending: Advanced Practice Midwife | Admitting: Advanced Practice Midwife

## 2022-12-10 VITALS — BP 117/70 | HR 67 | Wt 155.0 lb

## 2022-12-10 DIAGNOSIS — N898 Other specified noninflammatory disorders of vagina: Secondary | ICD-10-CM | POA: Diagnosis not present

## 2022-12-10 DIAGNOSIS — Z0000 Encounter for general adult medical examination without abnormal findings: Secondary | ICD-10-CM

## 2022-12-10 DIAGNOSIS — Z01419 Encounter for gynecological examination (general) (routine) without abnormal findings: Secondary | ICD-10-CM | POA: Diagnosis not present

## 2022-12-10 DIAGNOSIS — Z975 Presence of (intrauterine) contraceptive device: Secondary | ICD-10-CM | POA: Diagnosis not present

## 2022-12-10 NOTE — Progress Notes (Signed)
GYNECOLOGY ANNUAL PREVENTATIVE CARE ENCOUNTER NOTE  History:     Marjarie A Covarrubias Lorine Bears is a 32 y.o. [id number] female here for a routine annual gynecologic exam.  Current complaints: clear vaginal discharge, not foul-smelling. No vaginal itching or lesions noted.   Denies abnormal vaginal bleeding, pelvic pain, problems with intercourse or other gynecologic concerns.  Patient lives with her husband of 11 years and their children. Happy with Nexplanon.    Gynecologic History No LMP recorded. Patient has had an implant. Contraception: Nexplanon placed 02/06/2021 Last Pap: 11/27/2021. Result was normal with negative HPV   Obstetric History OB History  Gravida Para Term Preterm AB Living  3 3 3  0 0 3  SAB IAB Ectopic Multiple Live Births  0 0 0 0 3    # Outcome Date GA Lbr Len/2nd Weight Sex Delivery Anes PTL Lv  3 Term 01/01/21 [redacted]w[redacted]d 03:26 / 00:02 7 lb 7.9 oz (3.4 kg) F Vag-Spont None  LIV  2 Term 03/06/17 [redacted]w[redacted]d 02:08 7 lb 0.2 oz (3.181 kg) F Vag-Spont EPI  LIV  1 Term 12/20/14 [redacted]w[redacted]d 15:15 / 00:34 7 lb 3 oz (3.26 kg) M Vag-Spont EPI  LIV    Past Medical History:  Diagnosis Date   Dermoid cyst of ovary, left 11/19/2016   Possible dermoid on the left--re-scan postpartum   Medical history non-contributory    Papanicolaou smear of cervix with low grade squamous intraepithelial lesion (LGSIL) 10/12/2018   Noted on Pap 05/2016    Past Surgical History:  Procedure Laterality Date   RHINOPLASTY      Current Outpatient Medications on File Prior to Visit  Medication Sig Dispense Refill   ibuprofen (ADVIL) 600 MG tablet Take 1 tablet (600 mg total) by mouth every 6 (six) hours. (Patient not taking: Reported on 02/06/2021) 30 tablet 0   Prenat-FeFum-FePo-FA-Omega 3 (CONCEPT DHA) 53.5-38-1 MG CAPS Take 1 tablet by mouth daily. (Patient not taking: Reported on 11/27/2021) 30 capsule 8   No current facility-administered medications on file prior to visit.    No Known  Allergies  Social History:  reports that she has never smoked. She has never used smokeless tobacco. She reports that she does not drink alcohol and does not use drugs.  Family History  Problem Relation Age of Onset   Diabetes Brother    Diabetes Maternal Aunt     The following portions of the patient's history were reviewed and updated as appropriate: allergies, current medications, past family history, past medical history, past social history, past surgical history and problem list.  Review of Systems Pertinent items noted in HPI and remainder of comprehensive ROS otherwise negative.  Physical Exam:  BP 117/70   Pulse 67   Wt 155 lb (70.3 kg)   BMI 29.29 kg/m  CONSTITUTIONAL: Well-developed, well-nourished female in no acute distress.  HENT:  Normocephalic, atraumatic, External right and left ear normal.  EYES: Conjunctivae and EOM are normal. Pupils are equal, round, and reactive to light. No scleral icterus.  SKIN: Skin is warm and dry. No rash noted. Not diaphoretic. No erythema. No pallor. MUSCULOSKELETAL: Normal range of motion. No tenderness.  No cyanosis, clubbing, or edema. NEUROLOGIC: Alert and oriented to person, place, and time. Normal reflexes, muscle tone coordination.  PSYCHIATRIC: Normal mood and affect. Normal behavior. Normal judgment and thought content. CARDIOVASCULAR: Normal heart rate noted, regular rhythm RESPIRATORY: Clear to auscultation bilaterally. Effort and breath sounds normal, no problems with respiration noted. ABDOMEN: Soft, no distention noted.  No  tenderness, rebound or guarding.  PELVIC: Normal appearing external genitalia and urethral meatus; normal appearing vaginal mucosa and cervix.  No abnormal vaginal discharge noted.  Pap smear obtained.  Normal uterine size, no other palpable masses, no uterine or adnexal tenderness.  Performed in the presence of a chaperone.   Assessment and Plan:    1. Well woman exam without gynecological exam - No  concerning findings on physical exam  2. Vaginal discharge - Potentially normal flora, will wait for swab results and manage accordingly - Cervicovaginal ancillary only  3. Nexplanon in place     Routine preventative health maintenance measures emphasized. Please refer to After Visit Summary for other counseling recommendations.     Clayton Bibles, MSA, MSN, CNM Certified Nurse Midwife, Biochemist, clinical for [employer], [facility]

## 2022-12-10 NOTE — Progress Notes (Signed)
Patient presents for Annual.  LMP: Nexplanon  Last pap: Date: 11/24/2021- WNL Contraception: Nexplanon Mammogram: Not yet indicated STD Screening: Declines Flu Vaccine : N/A  CC:  Annual, denies any concerns

## 2022-12-11 LAB — CERVICOVAGINAL ANCILLARY ONLY
Bacterial Vaginitis (gardnerella): NEGATIVE
Candida Glabrata: NEGATIVE
Candida Vaginitis: NEGATIVE
Comment: NEGATIVE
Comment: NEGATIVE
Comment: NEGATIVE

## 2024-08-22 ENCOUNTER — Other Ambulatory Visit: Payer: Self-pay | Admitting: Family Medicine

## 2024-08-22 DIAGNOSIS — K760 Fatty (change of) liver, not elsewhere classified: Secondary | ICD-10-CM

## 2024-08-29 ENCOUNTER — Ambulatory Visit: Admitting: Family Medicine

## 2024-09-26 ENCOUNTER — Other Ambulatory Visit

## 2024-10-19 ENCOUNTER — Ambulatory Visit: Admitting: Family Medicine
